# Patient Record
Sex: Female | Born: 1985 | Race: Black or African American | Hispanic: No | Marital: Single | Smoking: Never smoker
Health system: Southern US, Community
[De-identification: ages and names within clinical notes are randomized; demographics above are authoritative.]

## PROBLEM LIST (undated history)

## (undated) DIAGNOSIS — R51 Headache: Secondary | ICD-10-CM

## (undated) HISTORY — PX: NO PAST SURGERIES: SHX2092

---

## 2007-07-27 ENCOUNTER — Emergency Department (HOSPITAL_COMMUNITY): Admission: EM | Admit: 2007-07-27 | Discharge: 2007-07-27 | Payer: Self-pay | Admitting: Emergency Medicine

## 2008-03-12 ENCOUNTER — Inpatient Hospital Stay (HOSPITAL_COMMUNITY): Admission: AD | Admit: 2008-03-12 | Discharge: 2008-03-12 | Payer: Self-pay | Admitting: Obstetrics & Gynecology

## 2008-05-26 ENCOUNTER — Emergency Department (HOSPITAL_COMMUNITY): Admission: EM | Admit: 2008-05-26 | Discharge: 2008-05-26 | Payer: Self-pay | Admitting: Emergency Medicine

## 2008-06-06 ENCOUNTER — Emergency Department (HOSPITAL_COMMUNITY): Admission: EM | Admit: 2008-06-06 | Discharge: 2008-06-06 | Payer: Self-pay | Admitting: Emergency Medicine

## 2008-08-19 ENCOUNTER — Ambulatory Visit (HOSPITAL_COMMUNITY): Admission: RE | Admit: 2008-08-19 | Discharge: 2008-08-19 | Payer: Self-pay | Admitting: Obstetrics & Gynecology

## 2008-10-12 ENCOUNTER — Inpatient Hospital Stay (HOSPITAL_COMMUNITY): Admission: AD | Admit: 2008-10-12 | Discharge: 2008-10-12 | Payer: Self-pay | Admitting: Family Medicine

## 2008-10-12 ENCOUNTER — Ambulatory Visit: Payer: Self-pay | Admitting: Obstetrics & Gynecology

## 2008-10-19 ENCOUNTER — Inpatient Hospital Stay (HOSPITAL_COMMUNITY): Admission: AD | Admit: 2008-10-19 | Discharge: 2008-10-19 | Payer: Self-pay | Admitting: Obstetrics & Gynecology

## 2008-10-19 ENCOUNTER — Ambulatory Visit: Payer: Self-pay | Admitting: Obstetrics and Gynecology

## 2009-01-06 ENCOUNTER — Inpatient Hospital Stay (HOSPITAL_COMMUNITY): Admission: AD | Admit: 2009-01-06 | Discharge: 2009-01-06 | Payer: Self-pay | Admitting: Obstetrics & Gynecology

## 2009-01-06 ENCOUNTER — Inpatient Hospital Stay (HOSPITAL_COMMUNITY): Admission: AD | Admit: 2009-01-06 | Discharge: 2009-01-08 | Payer: Self-pay | Admitting: Family Medicine

## 2009-01-06 ENCOUNTER — Ambulatory Visit: Payer: Self-pay | Admitting: Family Medicine

## 2010-04-07 ENCOUNTER — Encounter: Admission: RE | Admit: 2010-04-07 | Discharge: 2010-04-07 | Payer: Self-pay | Admitting: Internal Medicine

## 2011-03-07 LAB — CBC
HCT: 35.2 % — ABNORMAL LOW (ref 36.0–46.0)
MCV: 90.6 fL (ref 78.0–100.0)
WBC: 6.4 10*3/uL (ref 4.0–10.5)

## 2011-08-15 LAB — CBC
HCT: 33.4 — ABNORMAL LOW
MCHC: 34.1
MCV: 87.8

## 2011-08-15 LAB — HCG, QUANTITATIVE, PREGNANCY: hCG, Beta Chain, Quant, S: <2

## 2011-08-17 LAB — URINALYSIS, ROUTINE W REFLEX MICROSCOPIC
Bilirubin Urine: NEGATIVE
Glucose, UA: NEGATIVE
Ketones, ur: >80 — AB
Nitrite: NEGATIVE
Protein, ur: NEGATIVE
pH: 7

## 2011-08-17 LAB — URINE MICROSCOPIC-ADD ON

## 2011-08-17 LAB — POCT PREGNANCY, URINE: Preg Test, Ur: POSITIVE

## 2011-08-17 LAB — RPR: RPR Ser Ql: NONREACTIVE

## 2011-08-18 LAB — URINALYSIS, ROUTINE W REFLEX MICROSCOPIC: Specific Gravity, Urine: 1.035 — ABNORMAL HIGH

## 2011-08-21 ENCOUNTER — Encounter (HOSPITAL_COMMUNITY): Payer: Self-pay

## 2011-08-21 ENCOUNTER — Inpatient Hospital Stay (HOSPITAL_COMMUNITY): Payer: BC Managed Care – PPO

## 2011-08-21 ENCOUNTER — Inpatient Hospital Stay (HOSPITAL_COMMUNITY)
Admission: AD | Admit: 2011-08-21 | Discharge: 2011-08-21 | Disposition: A | Discharge status: Home / Self Care | Payer: BC Managed Care – PPO | Source: Ambulatory Visit | Attending: Obstetrics and Gynecology | Admitting: Obstetrics and Gynecology

## 2011-08-21 DIAGNOSIS — O34599 Maternal care for other abnormalities of gravid uterus, unspecified trimester: Secondary | ICD-10-CM | POA: Insufficient documentation

## 2011-08-21 DIAGNOSIS — R109 Unspecified abdominal pain: Secondary | ICD-10-CM | POA: Insufficient documentation

## 2011-08-21 DIAGNOSIS — Z3200 Encounter for pregnancy test, result unknown: Secondary | ICD-10-CM

## 2011-08-21 DIAGNOSIS — N83209 Unspecified ovarian cyst, unspecified side: Secondary | ICD-10-CM

## 2011-08-21 DIAGNOSIS — R51 Headache: Secondary | ICD-10-CM | POA: Insufficient documentation

## 2011-08-21 LAB — COMPREHENSIVE METABOLIC PANEL
ALT: 19 U/L (ref 0–35)
AST: 13 U/L (ref 0–37)
Alkaline Phosphatase: 53 U/L (ref 39–117)
GFR calc Af Amer: >90 mL/min (ref >90)
Potassium: 3.5 mEq/L (ref 3.5–5.1)
Sodium: 134 mEq/L — ABNORMAL LOW (ref 135–145)
Total Bilirubin: 0.2 mg/dL — ABNORMAL LOW (ref 0.3–1.2)
Total Protein: 7.4 g/dL (ref 6.0–8.3)

## 2011-08-21 LAB — CBC
Hemoglobin: 11 g/dL — ABNORMAL LOW (ref 12.0–15.0)
MCH: 29.3 pg (ref 26.0–34.0)
MCHC: 33.4 g/dL (ref 30.0–36.0)
Platelets: 327 10*3/uL (ref 150–400)
RBC: 3.76 MIL/uL — ABNORMAL LOW (ref 3.87–5.11)
WBC: 6.1 10*3/uL (ref 4.0–10.5)

## 2011-08-21 MED ORDER — BUTALBITAL-APAP-CAFFEINE 50-325-40 MG PO TABS
2.0000 | ORAL_TABLET | Freq: Once | ORAL | Status: AC
  Administered 2011-08-21: 2 via ORAL
  Filled 2011-08-21: qty 2

## 2011-08-21 NOTE — ED Provider Notes (Signed)
History   Pt presents today with multiple complaints. She is an extremely poor historian. She was initially seen today by urgent care. Pt c/o weakness, joint pain, HA, and abd pain. She denies vag dc, bleeding, or fever. Pt was sent to the MAU because she also had a positive preg test.  Chief Complaint  Patient presents with  . Dizziness   HPI  OB History    Grav Para Term Preterm Abortions TAB SAB Ect Mult Living   4 3 3  0  0 0 0 0 3      Past Medical History  Diagnosis Date  . No pertinent past medical history     Past Surgical History  Procedure Date  . No past surgeries     No family history on file.  History  Substance Use Topics  . Smoking status: Never Smoker   . Smokeless tobacco: Not on file  . Alcohol Use: No    Allergies: No Known Allergies  No prescriptions prior to admission    Review of Systems  Constitutional: Negative for fever.  Eyes: Negative for blurred vision.  Cardiovascular: Negative for chest pain and palpitations.  Gastrointestinal: Positive for abdominal pain. Negative for nausea, vomiting, diarrhea and constipation.  Genitourinary: Negative for dysuria, urgency, frequency and hematuria.  Musculoskeletal: Positive for joint pain.  Neurological: Positive for dizziness, weakness and headaches.  Psychiatric/Behavioral: Negative for depression and suicidal ideas.   Physical Exam   Blood pressure 120/55, pulse 74, temperature 98 F (36.7 C), temperature source Oral, resp. rate 20, height 5\' 2"  (1.575 m), weight 201 lb (91.173 kg), last menstrual period 06/14/2011, SpO2 97.00%.  Physical Exam  Constitutional: She is oriented to person, place, and time. She appears well-developed and well-nourished. No distress.  HENT:  Head: Normocephalic and atraumatic.  Eyes: EOM are normal. Pupils are equal, round, and reactive to light.  GI: Soft. She exhibits no distension. There is no tenderness. There is no rebound and no guarding.  Genitourinary:  No bleeding around the vagina. No vaginal discharge found.       Cervix Lg/closed. Bimanual exam difficult secondary to increased body habitus.  Neurological: She is alert and oriented to person, place, and time.  Skin: Skin is warm and dry. She is not diaphoretic.  Psychiatric: Her speech is normal and behavior is normal. Thought content normal. Her affect is blunt.    MAU Course  Procedures    Assessment and Plan  Care of pt turned over to [facility], FNP at 8pm.  Clinton Gallant. Rice III, DrHSc, MPAS, PA-C  08/21/2011, 7:32 PM   Assessment: IUP at 9.[redacted] weeks gestation   Ovarian cyst left   Headache - resolved with fiorset      Plan:  Start [facility]   Start PNV   Work note   Return as needed.  Crystal Figueroa, Crystal Figueroa 08/21/11 1957  US Ob Comp Less 14 Wks  08/21/2011  *RADIOLOGY REPORT*  Clinical Data: Pain  OBSTETRIC <14 WK [place] AND TRANSVAGINAL OB US  Technique:  Both transabdominal and transvaginal ultrasound examinations were performed for complete evaluation of the gestation as well as the maternal uterus, adnexal regions, and pelvic cul-de-sac.  Transvaginal technique was performed to assess early pregnancy.  Comparison:  None.  Intrauterine gestational sac:  Visualized/normal in shape. Yolk sac: Visualized Embryo: Visualized Cardiac Activity: Visualized Heart Rate: 179 bpm  CRL: 24   mm  9   w  1   d  [place] EDC: 03/24/2012  Maternal uterus/adnexae: The uterus is otherwise normal. No evidence of subchorionic hemorrhage/hematoma.  The right ovary is normal in size, measuring 2.0 x 1.6 x 1.7 cm.  No discrete right-sided adnexal lesions.  The left ovary is normal in size, measuring 3.9 x 2.5 x 3.0 cm.  There is an approximately 2.8 cm cyst within the left ovary.  No free fluid within the pelvis.  IMPRESSION: 1.  Single intrauterine pregnancy with crown-rump length measuring 24 mm compatible with 9 weeks, 1 days of gestation and estimated delivery date of 03/24/2012.  Full anatomy scan at 18-21 weeks  is recommended.  2.  Likely 2.8 cm left sided corpus luteal cyst.  Original Report Authenticated By: Waynard Reeds, M.D.    Results for orders placed during the hospital encounter of 08/21/11 (from the past 24 hour(s))  GLUCOSE, CAPILLARY     Status: Normal   Collection Time   08/21/11  7:08 PM      Component Value Range   Glucose-Capillary 93  70 - 99 (mg/dL)  GLUCOSE, CAPILLARY     Status: Normal   Collection Time   08/21/11  7:08 PM      Component Value Range   Glucose-Capillary 93  70 - 99 (mg/dL)  CBC     Status: Abnormal   Collection Time   08/21/11  7:31 PM      Component Value Range   WBC 6.1  4.0 - 10.5 (K/uL)   RBC 3.76 (*) 3.87 - 5.11 (MIL/uL)   Hemoglobin 11.0 (*) 12.0 - 15.0 (g/dL)   HCT 16.1 (*) 09.6 - 46.0 (%)   MCV 87.5  78.0 - 100.0 (fL)   MCH 29.3  26.0 - 34.0 (pg)   MCHC 33.4  30.0 - 36.0 (g/dL)   RDW 04.5  40.9 - 81.1 (%)   Platelets 327  150 - 400 (K/uL)  COMPREHENSIVE METABOLIC PANEL     Status: Abnormal   Collection Time   08/21/11  7:31 PM      Component Value Range   Sodium 134 (*) 135 - 145 (mEq/L)   Potassium 3.5  3.5 - 5.1 (mEq/L)   Chloride 100  96 - 112 (mEq/L)   CO2 27  19 - 32 (mEq/L)   Glucose, Bld 83  70 - 99 (mg/dL)   BUN 11  6 - 23 (mg/dL)   Creatinine, Ser 9.14  0.50 - 1.10 (mg/dL)   Calcium 9.7  8.4 - 78.2 (mg/dL)   Total Protein 7.4  6.0 - 8.3 (g/dL)   Albumin 3.3 (*) 3.5 - 5.2 (g/dL)   AST 13  0 - 37 (U/L)   ALT 19  0 - 35 (U/L)   Alkaline Phosphatase 53  39 - 117 (U/L)   Total Bilirubin 0.2 (*) 0.3 - 1.2 (mg/dL)   GFR calc non Af Amer >90  >90 (mL/min)   GFR calc Af Amer >90  >90 (mL/min)  HCG, QUANTITATIVE, PREGNANCY     Status: Abnormal   Collection Time   08/21/11  7:34 PM      Component Value Range   hCG, Beta Nyra Jabs, S 95621 (*) <5 (mIU/mL)    Kerrie Buffalo, NP 08/21/11 2134

## 2011-08-21 NOTE — Progress Notes (Signed)
Pt states has had a headache & dizziness x1 week. Also complains of abdominal cramping & pain "everywhere" that comes & goes. Denies vaginal bleeding or discharge. States hasn't had a period since July & had a positive UPT at urgent care today.

## 2011-08-22 LAB — URINALYSIS, ROUTINE W REFLEX MICROSCOPIC
Glucose, UA: NEGATIVE
Ketones, ur: NEGATIVE
Nitrite: NEGATIVE
Protein, ur: NEGATIVE
Protein, ur: NEGATIVE
Specific Gravity, Urine: <1.005 — ABNORMAL LOW
Urobilinogen, UA: 0.2

## 2011-09-02 ENCOUNTER — Emergency Department (HOSPITAL_COMMUNITY)
Admission: EM | Admit: 2011-09-02 | Discharge: 2011-09-02 | Disposition: A | Discharge status: Home / Self Care | Payer: BC Managed Care – PPO | Attending: Emergency Medicine | Admitting: Emergency Medicine

## 2011-09-02 DIAGNOSIS — O99891 Other specified diseases and conditions complicating pregnancy: Secondary | ICD-10-CM | POA: Insufficient documentation

## 2011-09-02 DIAGNOSIS — R51 Headache: Secondary | ICD-10-CM | POA: Insufficient documentation

## 2011-09-02 DIAGNOSIS — R5381 Other malaise: Secondary | ICD-10-CM | POA: Insufficient documentation

## 2011-09-02 DIAGNOSIS — IMO0001 Reserved for inherently not codable concepts without codable children: Secondary | ICD-10-CM | POA: Insufficient documentation

## 2011-09-02 DIAGNOSIS — R112 Nausea with vomiting, unspecified: Secondary | ICD-10-CM | POA: Insufficient documentation

## 2011-09-02 DIAGNOSIS — F329 Major depressive disorder, single episode, unspecified: Secondary | ICD-10-CM | POA: Insufficient documentation

## 2011-09-02 DIAGNOSIS — F3289 Other specified depressive episodes: Secondary | ICD-10-CM | POA: Insufficient documentation

## 2011-09-02 DIAGNOSIS — R630 Anorexia: Secondary | ICD-10-CM | POA: Insufficient documentation

## 2011-09-02 LAB — URINE MICROSCOPIC-ADD ON

## 2011-09-02 LAB — COMPREHENSIVE METABOLIC PANEL
ALT: 38 U/L — ABNORMAL HIGH (ref 0–35)
AST: 15 U/L (ref 0–37)
Albumin: 3 g/dL — ABNORMAL LOW (ref 3.5–5.2)
Alkaline Phosphatase: 47 U/L (ref 39–117)
BUN: 5 mg/dL — ABNORMAL LOW (ref 6–23)
Chloride: 103 mEq/L (ref 96–112)
Potassium: 3.2 mEq/L — ABNORMAL LOW (ref 3.5–5.1)
Total Bilirubin: 0.3 mg/dL (ref 0.3–1.2)

## 2011-09-02 LAB — URINALYSIS, ROUTINE W REFLEX MICROSCOPIC
Bilirubin Urine: NEGATIVE
Glucose, UA: NEGATIVE mg/dL
Ketones, ur: NEGATIVE mg/dL
Protein, ur: NEGATIVE mg/dL

## 2011-09-02 LAB — DIFFERENTIAL
Basophils Absolute: 0 10*3/uL (ref 0.0–0.1)
Eosinophils Relative: 2 % (ref 0–5)
Lymphocytes Relative: 38 % (ref 12–46)
Neutro Abs: 2.5 10*3/uL (ref 1.7–7.7)

## 2011-09-02 LAB — CBC
HCT: 28.6 % — ABNORMAL LOW (ref 36.0–46.0)
RDW: 12.5 % (ref 11.5–15.5)
WBC: 4.8 10*3/uL (ref 4.0–10.5)

## 2011-09-02 LAB — LIPASE, BLOOD: Lipase: 14 U/L (ref 11–59)

## 2011-09-13 ENCOUNTER — Other Ambulatory Visit: Payer: Self-pay | Admitting: Family Medicine

## 2011-09-13 DIAGNOSIS — Z3689 Encounter for other specified antenatal screening: Secondary | ICD-10-CM

## 2011-09-13 LAB — HIV ANTIBODY (ROUTINE TESTING W REFLEX): HIV: NONREACTIVE

## 2011-09-13 LAB — ANTIBODY SCREEN: Antibody Screen: NEGATIVE

## 2011-09-13 LAB — RPR: RPR: NONREACTIVE

## 2011-09-13 LAB — HEPATITIS B SURFACE ANTIGEN: Hepatitis B Surface Ag: NEGATIVE

## 2011-10-18 ENCOUNTER — Ambulatory Visit (HOSPITAL_COMMUNITY)
Admission: RE | Admit: 2011-10-18 | Discharge: 2011-10-18 | Disposition: A | Discharge status: Home / Self Care | Payer: Medicaid Other | Source: Ambulatory Visit | Attending: Family Medicine | Admitting: Family Medicine

## 2011-10-18 DIAGNOSIS — Z1389 Encounter for screening for other disorder: Secondary | ICD-10-CM | POA: Insufficient documentation

## 2011-10-18 DIAGNOSIS — O358XX0 Maternal care for other (suspected) fetal abnormality and damage, not applicable or unspecified: Secondary | ICD-10-CM | POA: Insufficient documentation

## 2011-10-18 DIAGNOSIS — Z363 Encounter for antenatal screening for malformations: Secondary | ICD-10-CM | POA: Insufficient documentation

## 2011-10-18 DIAGNOSIS — Z3689 Encounter for other specified antenatal screening: Secondary | ICD-10-CM

## 2011-11-08 ENCOUNTER — Other Ambulatory Visit (HOSPITAL_COMMUNITY): Payer: Self-pay | Admitting: Physician Assistant

## 2011-11-08 DIAGNOSIS — R319 Hematuria, unspecified: Secondary | ICD-10-CM

## 2011-11-15 ENCOUNTER — Ambulatory Visit (HOSPITAL_COMMUNITY)
Admission: RE | Admit: 2011-11-15 | Discharge: 2011-11-15 | Disposition: A | Discharge status: Home / Self Care | Payer: BC Managed Care – PPO | Source: Ambulatory Visit | Attending: Physician Assistant | Admitting: Physician Assistant

## 2011-11-15 DIAGNOSIS — O99891 Other specified diseases and conditions complicating pregnancy: Secondary | ICD-10-CM | POA: Insufficient documentation

## 2011-11-15 DIAGNOSIS — R319 Hematuria, unspecified: Secondary | ICD-10-CM | POA: Insufficient documentation

## 2012-02-21 ENCOUNTER — Observation Stay (HOSPITAL_COMMUNITY)
Admission: AD | Admit: 2012-02-21 | Discharge: 2012-02-23 | Disposition: A | Discharge status: Home / Self Care | Payer: BC Managed Care – PPO | Source: Ambulatory Visit | Attending: Obstetrics & Gynecology | Admitting: Obstetrics & Gynecology

## 2012-02-21 ENCOUNTER — Encounter (HOSPITAL_COMMUNITY): Payer: Self-pay | Admitting: *Deleted

## 2012-02-21 ENCOUNTER — Inpatient Hospital Stay (HOSPITAL_COMMUNITY): Payer: BC Managed Care – PPO

## 2012-02-21 DIAGNOSIS — O4690 Antepartum hemorrhage, unspecified, unspecified trimester: Principal | ICD-10-CM | POA: Insufficient documentation

## 2012-02-21 DIAGNOSIS — O4693 Antepartum hemorrhage, unspecified, third trimester: Secondary | ICD-10-CM | POA: Diagnosis present

## 2012-02-21 LAB — URINALYSIS, ROUTINE W REFLEX MICROSCOPIC
Bilirubin Urine: NEGATIVE
Glucose, UA: NEGATIVE mg/dL
Ketones, ur: NEGATIVE mg/dL
pH: 6.5 (ref 5.0–8.0)

## 2012-02-21 LAB — URINE MICROSCOPIC-ADD ON

## 2012-02-21 MED ORDER — ACETAMINOPHEN 325 MG PO TABS: 650.0000 mg | ORAL_TABLET | ORAL | Status: DC | PRN

## 2012-02-21 MED ORDER — CALCIUM CARBONATE ANTACID 500 MG PO CHEW: 2.0000 | CHEWABLE_TABLET | ORAL | Status: DC | PRN

## 2012-02-21 MED ORDER — DOCUSATE SODIUM 100 MG PO CAPS
100.0000 mg | ORAL_CAPSULE | Freq: Every day | ORAL | Status: DC
  Administered 2012-02-22: 100 mg via ORAL
  Filled 2012-02-21: qty 1

## 2012-02-21 MED ORDER — PRENATAL MULTIVITAMIN CH
1.0000 | ORAL_TABLET | Freq: Every day | ORAL | Status: DC
  Administered 2012-02-22: 1 via ORAL
  Filled 2012-02-21: qty 1

## 2012-02-21 MED ORDER — ZOLPIDEM TARTRATE 10 MG PO TABS: 10.0000 mg | ORAL_TABLET | Freq: Every evening | ORAL | Status: DC | PRN

## 2012-02-21 NOTE — H&P (Signed)
Crystal Figueroa is a 26 y.o. female presenting at 36.0wks for acute onset of vaginal bleeding x1. Pt awoke this morning and used the bathroom. As she walked back to her bed she felt a warm sensation running down her leg and noticed she was bleeding from her vagina. Low volume discharge per pt. Pt became worried and came to MAU. Denies any complications w/ previous pregnancies. All were SVD. PNC started at [redacted]wks gestation. Pt feels baby moving frequently but denies painful contractions. Endorses low back pain and feelings of "pressure."    Maternal Medical History:  Reason for admission: Reason for admission: vaginal bleeding.  Reason for Admission:   nauseaContractions: Frequency: irregular.   Perceived severity is mild.    Fetal activity: Perceived fetal activity is normal.   Last perceived fetal movement was within the past hour.     [redacted]w[redacted]d  OB History    Grav Para Term Preterm Abortions TAB SAB Ect Mult Living   4 3 3  0  0 0 0 0 3     Past Medical History  Diagnosis Date  . No pertinent past medical history    Past Surgical History  Procedure Date  . No past surgeries    Family History: family history is negative for Anesthesia problems. Social History:  reports that she has never smoked. She does not have any smokeless tobacco history on file. She reports that she does not drink alcohol or use illicit drugs.  Review of Systems  Constitutional: Negative for fever and chills.  Eyes: Negative for blurred vision and double vision.  Respiratory: Negative for cough and hemoptysis.   Cardiovascular: Negative for chest pain, palpitations and claudication.  Gastrointestinal: Negative for nausea, vomiting, abdominal pain, diarrhea, constipation and blood in stool.  Genitourinary: Negative for dysuria, urgency, frequency and hematuria.  Skin: Negative for itching and rash.  Neurological: Positive for headaches. Negative for dizziness and tingling.    Dilation: 1 Effacement (%):  Thick Exam by:: Dr. Konrad Dolores Blood pressure 105/56, pulse 90, temperature 98 F (36.7 C), temperature source Oral, resp. rate 18, height 5\' 2"  (1.575 m), weight 91.853 kg (202 lb 8 oz), last menstrual period 06/14/2011. Maternal Exam:  Uterine Assessment: Contraction strength is mild.  Contraction frequency is irregular.  Occuring irregularly every 6-16 min.  Abdomen: Fundal height is appropriate for GA.   Fetal presentation: vertex  Introitus: Normal vulva. Normal vagina.  Vagina is negative for discharge.  Ferning test: not done.  Nitrazine test: not done. Amniotic fluid character: not assessed.  Cervix: Cervix evaluated by sterile speculum exam and digital exam.     Physical Exam  Constitutional: She appears well-developed and well-nourished. No distress.  HENT:  Head: Normocephalic.  Eyes: EOM are normal.  Neck: Normal range of motion. Neck supple.  Cardiovascular: Normal rate, regular rhythm, normal heart sounds and intact distal pulses.  Exam reveals no gallop and no friction rub.   No murmur heard. Respiratory: Effort normal. No respiratory distress.  GI: Soft. Bowel sounds are normal. She exhibits no distension and no mass. There is no tenderness. There is no rebound and no guarding.  Genitourinary: Vagina normal and uterus normal. No vaginal discharge found.       Small amount of pooled around the cervix w/ some small clots present. Pt cervix 1-1.5 and long  Musculoskeletal: Normal range of motion.  Skin: Skin is warm and dry. She is not diaphoretic.  Psychiatric: She has a normal mood and affect. Her behavior is normal.  Prenatal labs: ABO, Rh:   Antibody:   Rubella:   RPR:    HBsAg:    HIV:    GBS:     Assessment/Plan: 26yo G4P3003 at 36 wks w/ concern for vaginal bleeding. Likely marginal separation. BPP reasuring at 8/8. US showing placenta @ "Rt lateral, above cervical os". Low concern for abruption as non-painful and reassurring fetal heart tones on  monitor.  - Admit for Obs - Monitor for DC - Place on fetal monitoring - Likely DC after 24 hrs.   MERRELL, DAVID 02/21/2012, 12:44 PM    Reviewed results and documentation of Resident and discussed with Dr. Debroah Loop: place in Antenatal  For 23 hr obs.

## 2012-02-21 NOTE — MAU Note (Signed)
Pt states she is having thick bright red vaginal bleeding since 0600 this morning.  Denies any ROM.

## 2012-02-22 NOTE — Progress Notes (Signed)
Patient ID: Crystal Figueroa, female   DOB: Apr 01, 1986, 26 y.o.   MRN: [id number] FACULTY PRACTICE ANTEPARTUM(COMPREHENSIVE) NOTE  Crystal Figueroa is a 26 y.o. G4P3003 at [redacted]w[redacted]d  who is admitted for bleeding.   Fetal presentation is cephalic. Length of Stay:  2  Days  Subjective: No complaints, no current bleeding Patient reports the fetal movement as active. Patient reports uterine contraction  activity as none. Patient reports  vaginal bleeding as spotting. None since last night Patient describes fluid per vagina as None.  Vitals:  Blood pressure 107/54, pulse 88, temperature 97.9 F (36.6 C), temperature source Oral, resp. rate 20, height 5\' 2"  (1.575 m), weight 202 lb 8 oz (91.853 kg), last menstrual period 06/14/2011. Physical Examination:  General appearance - alert, well appearing, and in no distress Abdomen - nontender soft Neurological - alert, oriented, normal speech, no focal findings or movement disorder noted, DTR's normal and symmetric Extremities , no pedal edema noted Fundal Height:  size equals dates Pelvic Exam:   Cervical Exam: Not evaluated. and found to be not evaluated/ na/na and fetal presentation is cephalic. Extremities: Homans sign is negative, no sign of DVT with DTRs 2+ bilaterally Membranes:intact  Fetal Monitoring:  Baseline: 140s bpm, Variability: Good {> 6 bpm) and Accelerations: Reactive  Labs:  Recent Results (from the past 24 hour(s))  URINALYSIS, ROUTINE W REFLEX MICROSCOPIC   Collection Time   02/21/12  8:55 AM      Component Value Range   Color, Urine YELLOW  YELLOW    APPearance CLEAR  CLEAR    Specific Gravity, Urine <1.005 (*) 1.005 - 1.030    pH 6.5  5.0 - 8.0    Glucose, UA NEGATIVE  NEGATIVE (mg/dL)   Hgb urine dipstick LARGE (*) NEGATIVE    Bilirubin Urine NEGATIVE  NEGATIVE    Ketones, ur NEGATIVE  NEGATIVE (mg/dL)   Protein, ur NEGATIVE  NEGATIVE (mg/dL)   Urobilinogen, UA 0.2  0.0 - 1.0 (mg/dL)   Nitrite NEGATIVE  NEGATIVE    Leukocytes, UA NEGATIVE  NEGATIVE   URINE MICROSCOPIC-ADD ON   Collection Time   02/21/12  8:55 AM      Component Value Range   Squamous Epithelial / LPF RARE  RARE    WBC, UA 0-2  <3 (WBC/hpf)   RBC / HPF 0-2  <3 (RBC/hpf)    Imaging Studies:      Medications:  Scheduled    . docusate sodium  100 mg Oral Daily  . prenatal multivitamin  1 tablet Oral Daily   I have reviewed the patient's current medications.  ASSESSMENT: [redacted] weeks gestation with 3rd trimester bleeding, probably marginal sinus Bleeding has stopped about 12 hours, no labor  PLAN: Continue in house observation for now with bleeding having stopped just 12 hours previously, no signs of labor  Leonila Speranza H 02/22/2012,7:52 AM

## 2012-02-22 NOTE — Progress Notes (Signed)
UR Chart review completed.  

## 2012-02-23 DIAGNOSIS — O479 False labor, unspecified: Secondary | ICD-10-CM

## 2012-02-23 NOTE — Discharge Summary (Addendum)
Obstetric Discharge Summary Reason for Admission: Vaginal bleeding Prenatal Procedures: ultrasound showing placenta at Rt lateral position above the cervical os w/ BPP of 8/8  Hemoglobin  Date Value Range Status  09/02/2011 9.8* 12.0-15.0 (g/dL) Final     HCT  Date Value Range Status  09/02/2011 28.6* 36.0-46.0 (%) Final    Physical Exam:  General: alert, cooperative, appears stated age and no distress CV: rrr Resp: normal effort Ext: 2+ distal pulses, trace LE edema, no calf tenderness  Discharge Diagnoses: Bloody discharge due to cervical changes.   Discharge Information: 26 yo [redacted]w[redacted]d f w/ minimal bloody discharge x1 w/o discharge since admission. Reasons for return discussed w/ pt. Pt to resume normal PNC.  Date: 02/23/2012 Activity: unrestricted Diet: routine Medications: PNV Condition: stable Discharge to: home   Byrant Valent 02/23/2012, 10:06 AM

## 2012-02-23 NOTE — Discharge Summary (Signed)
I was present for the exam and agree with above.  Dorathy Kinsman 02/23/2012 5:26 PM

## 2012-02-23 NOTE — Discharge Instructions (Signed)
Everything appears to be going well with your pregnancy. Please continue to see your OB for normal prenatal care. Please return to the hospital if you experience severe abdominal pain, feel light headed or pass out, if you develop a fever, or feel a gush of fluid. Have a great weekend.

## 2012-02-26 LAB — STREP B DNA PROBE: GBS: NEGATIVE

## 2012-03-04 ENCOUNTER — Inpatient Hospital Stay (HOSPITAL_COMMUNITY)
Admission: AD | Admit: 2012-03-04 | Discharge: 2012-03-04 | Disposition: A | Discharge status: Home / Self Care | Payer: BC Managed Care – PPO | Source: Ambulatory Visit | Attending: Obstetrics and Gynecology | Admitting: Obstetrics and Gynecology

## 2012-03-04 ENCOUNTER — Inpatient Hospital Stay (HOSPITAL_COMMUNITY): Payer: BC Managed Care – PPO

## 2012-03-04 ENCOUNTER — Encounter (HOSPITAL_COMMUNITY): Payer: Self-pay | Admitting: *Deleted

## 2012-03-04 DIAGNOSIS — O4690 Antepartum hemorrhage, unspecified, unspecified trimester: Secondary | ICD-10-CM | POA: Insufficient documentation

## 2012-03-04 DIAGNOSIS — O479 False labor, unspecified: Secondary | ICD-10-CM

## 2012-03-04 HISTORY — DX: Headache: R51

## 2012-03-04 NOTE — MAU Note (Signed)
Patient states she has a little bloody show and some contractions " that are not that bad". Reports good fetal movement.

## 2012-03-04 NOTE — MAU Note (Signed)
Has been having contractions every day since last week, getting closer and stronger.  Bleeding noted this morning at 0615.  Denies rom.

## 2012-03-04 NOTE — MAU Provider Note (Signed)
History     CSN: [id number]  Arrival date and time: 03/04/12 0806   None     Chief Complaint  Patient presents with  . Labor Eval   HPI  Ms. Crystal Figueroa is a [phone] [redacted]w[redacted]d female here for vaginal bleeding, lumbar pain, upper abdominal pain and suprapubic pain. She was admitted to the antenatal unit on 02/21/12 for vaginal bleeding and was discharged on the same day following ultrasound showing placenta at Rt lateral position above the cervical os w/ BPP of 8/8 and decreased in vaginal bleeding. Reports no SROM.   She reports bloody vaginal discharge starting this morning from 6:15 am. She describes the amount as "a lot" and reports that a handful sized clot was seen in the toilet.    Her pain started last night and is consistent with the onset of contractions. Her contractions are 7-10 minutes apart. Her pain is described as pressure and is a 6/10.     OB History    Grav Para Term Preterm Abortions TAB SAB Ect Mult Living   4 3 3  0  0 0 0 0 3      Past Medical History  Diagnosis Date  . Headache     Past Surgical History  Procedure Date  . No past surgeries     Family History  Problem Relation Age of Onset  . Anesthesia problems Neg Hx     History  Substance Use Topics  . Smoking status: Never Smoker   . Smokeless tobacco: Never Used  . Alcohol Use: No    Allergies: No Known Allergies  Prescriptions prior to admission  Medication Sig Dispense Refill  . Prenatal Vit-Fe Fumarate-FA (PRENATAL MULTIVITAMIN) TABS Take 1 tablet by mouth at bedtime.         Review of Systems  Constitutional: Negative for fever and chills.  Eyes: Negative for blurred vision.  Respiratory: Negative for shortness of breath.   Cardiovascular: Negative for claudication and leg swelling.  Gastrointestinal: Positive for abdominal pain. Negative for nausea and vomiting.  Neurological: Positive for headaches. Negative for dizziness.   Physical Exam   Blood pressure 98/52, pulse 96,  temperature 98.8 F (37.1 C), temperature source Oral, resp. rate 16, height 5\' 1"  (1.549 m), weight 91.173 kg (201 lb), last menstrual period 06/14/2011, SpO2 98.00%.  Physical Exam  Constitutional: She appears well-developed and well-nourished.  HENT:  Head: Normocephalic.  Cardiovascular: Normal rate, regular rhythm, normal heart sounds and intact distal pulses.  Exam reveals no gallop and no friction rub.   No murmur heard. Respiratory: Effort normal and breath sounds normal. No respiratory distress. She has no wheezes. She has no rales.  Homans sign is negative, no sign of DVT with DTRs 2+ bilaterally   MAU Course  Procedures  MDM  Assessment and Plan  1.) Vaginal Bleeding  US Fetal BPP w/o non stress ordered  Monitor bleeding Monitor NST  Consider Admitting    Crystal Figueroa 03/04/2012, 9:31 AM    RESIDENT ADDENDUM   Pt seen and examined. Chart Reviewed  [place] as below: Placenta Posterior and above the cervical os. AFI 13.34. BPP 8/8. Cervix long/closed. No acute abnormality. Vertex.  PE: Gen: no distress CV: RRR.  Resp: Normal effort Extremities: trace edema, 2+ pulses Abd: Non-painful to palpation. Gravid Vaginal: mucusy mildly bloody discharge.   Dilation: 1 Effacement (%): 30 Cervical Position: Middle Station: -2 Presentation: Undeterminable Exam by:: Dr. Suzzanne Figueroa mucusy trace bloody discharge from cervical opening.   A/P  26yo [redacted]w[redacted]d [id number], w/ vaginal bleeding. Pt recently admitted and observed for >24 hrs w/ similar complaints. Vaginal/speculum exam reasurring. Likely due to cervical change. Low concern for marginal separation or abruption. Reasons for return discussed w/ pt.    Patient seen and examined.  Agree with above note.  Crystal Figueroa 03/04/2012 12:54 PM

## 2012-03-05 ENCOUNTER — Encounter (HOSPITAL_COMMUNITY): Payer: Self-pay | Admitting: *Deleted

## 2012-03-05 ENCOUNTER — Inpatient Hospital Stay (HOSPITAL_COMMUNITY)
Admission: AD | Admit: 2012-03-05 | Discharge: 2012-03-09 | DRG: 651 | Disposition: A | Discharge status: Home / Self Care | Payer: BC Managed Care – PPO | Source: Ambulatory Visit | Attending: Obstetrics and Gynecology | Admitting: Obstetrics and Gynecology

## 2012-03-05 DIAGNOSIS — Z283 Underimmunization status: Secondary | ICD-10-CM

## 2012-03-05 DIAGNOSIS — O4693 Antepartum hemorrhage, unspecified, third trimester: Secondary | ICD-10-CM | POA: Diagnosis present

## 2012-03-05 DIAGNOSIS — O09899 Supervision of other high risk pregnancies, unspecified trimester: Secondary | ICD-10-CM

## 2012-03-05 DIAGNOSIS — IMO0002 Reserved for concepts with insufficient information to code with codable children: Secondary | ICD-10-CM | POA: Insufficient documentation

## 2012-03-05 DIAGNOSIS — O4590 Premature separation of placenta, unspecified, unspecified trimester: Principal | ICD-10-CM | POA: Diagnosis present

## 2012-03-05 DIAGNOSIS — Z2839 Other underimmunization status: Secondary | ICD-10-CM

## 2012-03-05 DIAGNOSIS — Z8659 Personal history of other mental and behavioral disorders: Secondary | ICD-10-CM | POA: Insufficient documentation

## 2012-03-05 DIAGNOSIS — D649 Anemia, unspecified: Secondary | ICD-10-CM | POA: Diagnosis present

## 2012-03-05 LAB — CBC
HCT: 30.2 % — ABNORMAL LOW (ref 36.0–46.0)
Hemoglobin: 10 g/dL — ABNORMAL LOW (ref 12.0–15.0)
MCH: 29.9 pg (ref 26.0–34.0)
MCHC: 33.1 g/dL (ref 30.0–36.0)
MCV: 90.1 fL (ref 78.0–100.0)
RBC: 3.35 MIL/uL — ABNORMAL LOW (ref 3.87–5.11)

## 2012-03-05 MED ORDER — DOCUSATE SODIUM 100 MG PO CAPS
100.0000 mg | ORAL_CAPSULE | Freq: Every day | ORAL | Status: DC
  Administered 2012-03-05: 100 mg via ORAL
  Filled 2012-03-05: qty 1

## 2012-03-05 MED ORDER — CALCIUM CARBONATE ANTACID 500 MG PO CHEW: 2.0000 | CHEWABLE_TABLET | ORAL | Status: DC | PRN

## 2012-03-05 MED ORDER — LACTATED RINGERS IV SOLN
INTRAVENOUS | Status: DC
  Administered 2012-03-05 – 2012-03-06 (×2): via INTRAVENOUS

## 2012-03-05 MED ORDER — ACETAMINOPHEN 325 MG PO TABS: 650.0000 mg | ORAL_TABLET | ORAL | Status: DC | PRN

## 2012-03-05 MED ORDER — ZOLPIDEM TARTRATE 10 MG PO TABS: 10.0000 mg | ORAL_TABLET | Freq: Every evening | ORAL | Status: DC | PRN

## 2012-03-05 MED ORDER — PRENATAL MULTIVITAMIN CH: 1.0000 | ORAL_TABLET | Freq: Every day | ORAL | Status: DC

## 2012-03-05 NOTE — H&P (Signed)
HPI: Crystal Figueroa is a 26 y.o. year old G59P3003 female at [redacted]w[redacted]d weeks gestation by LMP, verified by first trimester [place] who presents to MAU reporting vaginal bleeding. This is her third visit in the past two weeks for the same complaint. She was admitted 4/3-4/5 w/ no further bleeding, seen in MAU yesterday w/ normal [place], small amount bleeding on exam (moderate w/ clots at home) and reports bleeding a large amount at home this evening prior to arrival. The pad she was wearing en route was 80% saturated and the pad she wore for 15 minutes in triage was 30% saturated. She received prenatal care at [facility].  Maternal Medical History:  Reason for admission: Reason for admission: vaginal bleeding.  Reason for Admission:   nausea  OB History    Grav Para Term Preterm Abortions TAB SAB Ect Mult Living   4 3 3  0  0 0 0 0 3     Past Medical History  Diagnosis Date  . Headache    Past Surgical History  Procedure Date  . No past surgeries    Family History: family history is negative for Anesthesia problems. Social History:  reports that she has never smoked. She has never used smokeless tobacco. She reports that she does not drink alcohol or use illicit drugs.  Review of Systems  Constitutional: Negative for fever and chills.  Eyes: Negative for blurred vision.  Gastrointestinal: Positive for abdominal pain. Negative for nausea and vomiting. Blood in stool: mild, intermittent.  Neurological: Negative for dizziness and headaches.  Endo/Heme/Allergies: Does not bruise/bleed easily.    Dilation: 1 Effacement (%): Thick Blood pressure 103/69, pulse 98, temperature 98.2 F (36.8 C), temperature source Oral, resp. rate 18, last menstrual period 06/14/2011. Maternal Exam:  Uterine Assessment: Contraction strength is mild.  Contraction frequency is irregular.   Abdomen: Fundal height is S=D.    Introitus: Vagina is negative for discharge.  Moderate amount of BRB in vault. Small amount oozing from  os.  Pelvis: adequate for delivery.   Cervix: Cervix evaluated by sterile speculum exam and digital exam.     Fetal Exam Fetal Monitor Review: Mode: ultrasound.   Baseline rate: 140.  Variability: moderate (6-25 bpm).   Pattern: accelerations present and no decelerations.    Fetal State Assessment: Category I - tracings are normal.     Physical Exam  Nursing note and vitals reviewed. Constitutional: She is oriented to person, place, and time. She appears well-developed and well-nourished. No distress.  HENT:  Head: Normocephalic.  Eyes: Pupils are equal, round, and reactive to light.  Cardiovascular: Normal rate and regular rhythm.   Respiratory: Effort normal and breath sounds normal.  GI: Soft. There is no tenderness.  Genitourinary: There is no lesion or injury on the right labia. There is no lesion or injury on the left labia. Uterus is not tender. Bleeding: moderate BRB. No vaginal discharge found.  Musculoskeletal: Normal range of motion. She exhibits no edema.  Neurological: She is alert and oriented to person, place, and time. She has normal reflexes.  Skin: Skin is warm and dry. No pallor.  Psychiatric: She has a normal mood and affect.   Dilation: 1 Effacement (%): Thick Cervical Position: Anterior Presenting Part: indeterminate (Vtx per [place] yesterday. Will check w/ BS [place])   Prenatal labs: ABO, Rh: --/--/A POS (04/16 1935) Antibody: NEG (04/16 1935) Rubella: Nonimmune (10/24 0000) RPR: Nonreactive (10/24 0000)  HBsAg: Negative (10/24 0000)  HIV: Non-reactive (10/24 0000)  GBS:  Neg 1 hour GTT 68 Genetic screening declined  Assessment: 1. Labor: none 2. Fetal Wellbeing: Category I  3. Pain Control: None 4. GBS: neg 5. 37.6 week IUP 6. Suspect marginal abruption  Plan:  1. Admit to Antenatal per consult with Dr. Jolayne Panther 2. Routine Antenatal orders 3. IV, T&S, CBC, UDS, RPR 4. Will monitor bleeding over night and determine if pt needs IOL. Discussed  w/ pt the possibility of emergency C/S if bleeding became very heavy and/or for NRFHTs.  Dorathy Kinsman 03/05/2012, 8:36 PM

## 2012-03-05 NOTE — MAU Note (Signed)
Pt states she was seen in MAU 03/04/2012. For vaginal bleeding and was sent home. Pt states she continued to have bleeding last night. Pt states she was having the same bleeding as yesterday , but this evening bleeding has increased

## 2012-03-06 ENCOUNTER — Encounter (HOSPITAL_COMMUNITY): Payer: Self-pay | Admitting: Anesthesiology

## 2012-03-06 ENCOUNTER — Inpatient Hospital Stay (HOSPITAL_COMMUNITY): Payer: BC Managed Care – PPO | Admitting: Anesthesiology

## 2012-03-06 ENCOUNTER — Encounter (HOSPITAL_COMMUNITY)
Admission: AD | Disposition: A | Discharge status: Home / Self Care | Payer: Self-pay | Source: Ambulatory Visit | Attending: Obstetrics and Gynecology

## 2012-03-06 ENCOUNTER — Encounter (HOSPITAL_COMMUNITY): Payer: Self-pay | Admitting: Family Medicine

## 2012-03-06 DIAGNOSIS — O4590 Premature separation of placenta, unspecified, unspecified trimester: Secondary | ICD-10-CM

## 2012-03-06 DIAGNOSIS — O368390 Maternal care for abnormalities of the fetal heart rate or rhythm, unspecified trimester, not applicable or unspecified: Secondary | ICD-10-CM

## 2012-03-06 DIAGNOSIS — O4690 Antepartum hemorrhage, unspecified, unspecified trimester: Secondary | ICD-10-CM

## 2012-03-06 LAB — CBC
HCT: 27.5 % — ABNORMAL LOW (ref 36.0–46.0)
MCH: 29.9 pg (ref 26.0–34.0)
MCHC: 32.7 g/dL (ref 30.0–36.0)
MCV: 91.4 fL (ref 78.0–100.0)
Platelets: 305 10*3/uL (ref 150–400)
RDW: 13.9 % (ref 11.5–15.5)
WBC: 6.4 10*3/uL (ref 4.0–10.5)

## 2012-03-06 LAB — RAPID URINE DRUG SCREEN, HOSP PERFORMED
Amphetamines: NOT DETECTED
Tetrahydrocannabinol: NOT DETECTED

## 2012-03-06 SURGERY — Surgical Case
Anesthesia: General | Site: Abdomen | Wound class: Clean Contaminated

## 2012-03-06 MED ORDER — SIMETHICONE 80 MG PO CHEW
80.0000 mg | CHEWABLE_TABLET | Freq: Three times a day (TID) | ORAL | Status: DC
  Administered 2012-03-06 – 2012-03-09 (×7): 80 mg via ORAL

## 2012-03-06 MED ORDER — CEFAZOLIN SODIUM 1-5 GM-% IV SOLN
INTRAVENOUS | Status: AC
  Filled 2012-03-06: qty 50

## 2012-03-06 MED ORDER — IBUPROFEN 600 MG PO TABS: 600.0000 mg | ORAL_TABLET | Freq: Four times a day (QID) | ORAL | Status: DC | PRN

## 2012-03-06 MED ORDER — DIPHENHYDRAMINE HCL 25 MG PO CAPS
25.0000 mg | ORAL_CAPSULE | Freq: Four times a day (QID) | ORAL | Status: DC | PRN
  Filled 2012-03-06: qty 1

## 2012-03-06 MED ORDER — BUPIVACAINE HCL (PF) 0.25 % IJ SOLN
INTRAMUSCULAR | Status: DC | PRN
  Administered 2012-03-06: 30 mL

## 2012-03-06 MED ORDER — NALBUPHINE SYRINGE 5 MG/0.5 ML
10.0000 mg | INJECTION | INTRAMUSCULAR | Status: DC | PRN
  Administered 2012-03-06: 10 mg via INTRAVENOUS
  Administered 2012-03-06 (×2): 5 mg via INTRAVENOUS
  Filled 2012-03-06 (×2): qty 0.5
  Filled 2012-03-06: qty 1

## 2012-03-06 MED ORDER — LIDOCAINE HCL (PF) 1 % IJ SOLN: 30.0000 mL | INTRAMUSCULAR | Status: DC | PRN

## 2012-03-06 MED ORDER — ONDANSETRON HCL 4 MG/2ML IJ SOLN: 4.0000 mg | INTRAMUSCULAR | Status: DC | PRN

## 2012-03-06 MED ORDER — SUCCINYLCHOLINE CHLORIDE 20 MG/ML IJ SOLN
INTRAMUSCULAR | Status: DC | PRN
  Administered 2012-03-06: 140 mg via INTRAVENOUS

## 2012-03-06 MED ORDER — HETASTARCH-ELECTROLYTES 6 % IV SOLN
INTRAVENOUS | Status: DC | PRN
  Administered 2012-03-06: 14:00:00 via INTRAVENOUS

## 2012-03-06 MED ORDER — PROPOFOL 10 MG/ML IV EMUL
INTRAVENOUS | Status: AC
  Filled 2012-03-06: qty 20

## 2012-03-06 MED ORDER — SENNOSIDES-DOCUSATE SODIUM 8.6-50 MG PO TABS
2.0000 | ORAL_TABLET | Freq: Every day | ORAL | Status: DC
  Administered 2012-03-06 – 2012-03-08 (×3): 2 via ORAL

## 2012-03-06 MED ORDER — EPHEDRINE 5 MG/ML INJ
INTRAVENOUS | Status: AC
  Filled 2012-03-06: qty 10

## 2012-03-06 MED ORDER — DIPHENHYDRAMINE HCL 12.5 MG/5ML PO ELIX
12.5000 mg | ORAL_SOLUTION | Freq: Four times a day (QID) | ORAL | Status: DC | PRN
  Filled 2012-03-06: qty 5

## 2012-03-06 MED ORDER — MORPHINE SULFATE (PF) 1 MG/ML IV SOLN: INTRAVENOUS | Status: DC

## 2012-03-06 MED ORDER — PHENYLEPHRINE HCL 10 MG/ML IJ SOLN
INTRAMUSCULAR | Status: DC | PRN
  Administered 2012-03-06 (×2): 80 mg via INTRAVENOUS

## 2012-03-06 MED ORDER — MIDAZOLAM HCL 2 MG/2ML IJ SOLN
INTRAMUSCULAR | Status: AC
  Filled 2012-03-06: qty 2

## 2012-03-06 MED ORDER — HETASTARCH-ELECTROLYTES 6 % IV SOLN
INTRAVENOUS | Status: AC
  Filled 2012-03-06: qty 500

## 2012-03-06 MED ORDER — HYDROMORPHONE HCL PF 1 MG/ML IJ SOLN
INTRAMUSCULAR | Status: AC
  Filled 2012-03-06: qty 1

## 2012-03-06 MED ORDER — EPHEDRINE SULFATE 50 MG/ML IJ SOLN
INTRAMUSCULAR | Status: DC | PRN
  Administered 2012-03-06: 25 mg via INTRAVENOUS

## 2012-03-06 MED ORDER — FENTANYL CITRATE 0.05 MG/ML IJ SOLN
INTRAMUSCULAR | Status: AC
  Filled 2012-03-06: qty 5

## 2012-03-06 MED ORDER — LANOLIN HYDROUS EX OINT: 1.0000 "application " | TOPICAL_OINTMENT | CUTANEOUS | Status: DC | PRN

## 2012-03-06 MED ORDER — OXYCODONE-ACETAMINOPHEN 5-325 MG PO TABS
1.0000 | ORAL_TABLET | ORAL | Status: DC | PRN
Start: 2012-03-06 — End: 2012-03-06

## 2012-03-06 MED ORDER — MEASLES, MUMPS & RUBELLA VAC ~~LOC~~ INJ
0.5000 mL | INJECTION | Freq: Once | SUBCUTANEOUS | Status: AC
  Administered 2012-03-09: 0.5 mL via SUBCUTANEOUS
  Filled 2012-03-06: qty 0.5

## 2012-03-06 MED ORDER — WITCH HAZEL-GLYCERIN EX PADS: 1.0000 "application " | MEDICATED_PAD | CUTANEOUS | Status: DC | PRN

## 2012-03-06 MED ORDER — OXYTOCIN BOLUS FROM INFUSION
500.0000 mL | Freq: Once | INTRAVENOUS | Status: DC
  Filled 2012-03-06: qty 500

## 2012-03-06 MED ORDER — LACTATED RINGERS IV SOLN
500.0000 mL | INTRAVENOUS | Status: DC | PRN
  Administered 2012-03-06: 500 mL via INTRAVENOUS

## 2012-03-06 MED ORDER — HYDROMORPHONE HCL PF 1 MG/ML IJ SOLN
0.2500 mg | INTRAMUSCULAR | Status: DC | PRN
  Administered 2012-03-06 (×2): 0.5 mg via INTRAVENOUS

## 2012-03-06 MED ORDER — ZOLPIDEM TARTRATE 5 MG PO TABS: 5.0000 mg | ORAL_TABLET | Freq: Every evening | ORAL | Status: DC | PRN

## 2012-03-06 MED ORDER — OXYTOCIN 10 UNIT/ML IJ SOLN
INTRAMUSCULAR | Status: AC
  Filled 2012-03-06: qty 2

## 2012-03-06 MED ORDER — OXYTOCIN 20 UNITS IN LACTATED RINGERS INFUSION - SIMPLE
INTRAVENOUS | Status: AC
  Filled 2012-03-06: qty 1000

## 2012-03-06 MED ORDER — ONDANSETRON HCL 4 MG/2ML IJ SOLN: 4.0000 mg | Freq: Four times a day (QID) | INTRAMUSCULAR | Status: DC | PRN

## 2012-03-06 MED ORDER — TERBUTALINE SULFATE 1 MG/ML IJ SOLN: 0.2500 mg | Freq: Once | INTRAMUSCULAR | Status: DC | PRN

## 2012-03-06 MED ORDER — HYDROMORPHONE HCL PF 1 MG/ML IJ SOLN
INTRAMUSCULAR | Status: DC | PRN
  Administered 2012-03-06 (×2): 0.5 mg via INTRAVENOUS

## 2012-03-06 MED ORDER — SODIUM CHLORIDE 0.9 % IJ SOLN: 9.0000 mL | INTRAMUSCULAR | Status: DC | PRN

## 2012-03-06 MED ORDER — NALOXONE HCL 0.4 MG/ML IJ SOLN: 0.4000 mg | INTRAMUSCULAR | Status: DC | PRN

## 2012-03-06 MED ORDER — IBUPROFEN 600 MG PO TABS
600.0000 mg | ORAL_TABLET | Freq: Four times a day (QID) | ORAL | Status: DC
  Administered 2012-03-06 – 2012-03-09 (×10): 600 mg via ORAL
  Filled 2012-03-06 (×2): qty 1
  Filled 2012-03-06: qty 2
  Filled 2012-03-06 (×2): qty 1
  Filled 2012-03-06: qty 2
  Filled 2012-03-06 (×3): qty 1

## 2012-03-06 MED ORDER — FENTANYL CITRATE 0.05 MG/ML IJ SOLN
INTRAMUSCULAR | Status: DC | PRN
  Administered 2012-03-06: 50 ug via INTRAVENOUS
  Administered 2012-03-06: 100 ug via INTRAVENOUS
  Administered 2012-03-06 (×2): 50 ug via INTRAVENOUS
  Administered 2012-03-06: 100 ug via INTRAVENOUS

## 2012-03-06 MED ORDER — PRENATAL MULTIVITAMIN CH
1.0000 | ORAL_TABLET | Freq: Every day | ORAL | Status: DC
  Administered 2012-03-08 – 2012-03-09 (×2): 1 via ORAL
  Filled 2012-03-06 (×3): qty 1

## 2012-03-06 MED ORDER — OXYTOCIN 20 UNITS IN LACTATED RINGERS INFUSION - SIMPLE
INTRAVENOUS | Status: DC | PRN
  Administered 2012-03-06: 20 [IU] via INTRAVENOUS

## 2012-03-06 MED ORDER — KETOROLAC TROMETHAMINE 30 MG/ML IJ SOLN
15.0000 mg | Freq: Once | INTRAMUSCULAR | Status: AC | PRN
  Administered 2012-03-06: 30 mg via INTRAVENOUS

## 2012-03-06 MED ORDER — MIDAZOLAM HCL 5 MG/ML IJ SOLN
INTRAMUSCULAR | Status: DC | PRN
  Administered 2012-03-06: 2 mg via INTRAVENOUS

## 2012-03-06 MED ORDER — PHENYLEPHRINE 40 MCG/ML (10ML) SYRINGE FOR IV PUSH (FOR BLOOD PRESSURE SUPPORT)
PREFILLED_SYRINGE | INTRAVENOUS | Status: AC
  Filled 2012-03-06: qty 5

## 2012-03-06 MED ORDER — DIBUCAINE 1 % RE OINT: 1.0000 "application " | TOPICAL_OINTMENT | RECTAL | Status: DC | PRN

## 2012-03-06 MED ORDER — DEXTROSE IN LACTATED RINGERS 5 % IV SOLN
INTRAVENOUS | Status: DC
  Administered 2012-03-06: 21:00:00 via INTRAVENOUS

## 2012-03-06 MED ORDER — PROPOFOL 10 MG/ML IV EMUL
INTRAVENOUS | Status: DC | PRN
  Administered 2012-03-06: 200 mg via INTRAVENOUS

## 2012-03-06 MED ORDER — MISOPROSTOL 25 MCG QUARTER TABLET: 25.0000 ug | ORAL_TABLET | ORAL | Status: DC | PRN

## 2012-03-06 MED ORDER — LACTATED RINGERS IV SOLN
INTRAVENOUS | Status: DC
Start: 2012-03-06 — End: 2012-03-06
  Administered 2012-03-06: 10:00:00 via INTRAVENOUS

## 2012-03-06 MED ORDER — ONDANSETRON HCL 4 MG PO TABS: 4.0000 mg | ORAL_TABLET | ORAL | Status: DC | PRN

## 2012-03-06 MED ORDER — LACTATED RINGERS IV SOLN
INTRAVENOUS | Status: DC | PRN
  Administered 2012-03-06 (×2): via INTRAVENOUS

## 2012-03-06 MED ORDER — FLEET ENEMA 7-19 GM/118ML RE ENEM: 1.0000 | ENEMA | RECTAL | Status: DC | PRN

## 2012-03-06 MED ORDER — OXYTOCIN 20 UNITS IN LACTATED RINGERS INFUSION - SIMPLE
125.0000 mL/h | INTRAVENOUS | Status: AC
  Administered 2012-03-06: 125 mL/h via INTRAVENOUS

## 2012-03-06 MED ORDER — SUCCINYLCHOLINE CHLORIDE 20 MG/ML IJ SOLN
INTRAMUSCULAR | Status: AC
  Filled 2012-03-06: qty 10

## 2012-03-06 MED ORDER — CITRIC ACID-SODIUM CITRATE 334-500 MG/5ML PO SOLN: 30.0000 mL | ORAL | Status: DC | PRN

## 2012-03-06 MED ORDER — CEFAZOLIN SODIUM 1-5 GM-% IV SOLN
INTRAVENOUS | Status: DC | PRN
  Administered 2012-03-06: 1 g via INTRAVENOUS

## 2012-03-06 MED ORDER — OXYCODONE-ACETAMINOPHEN 5-325 MG PO TABS
1.0000 | ORAL_TABLET | ORAL | Status: DC | PRN
  Administered 2012-03-07 – 2012-03-08 (×9): 1 via ORAL
  Filled 2012-03-06 (×10): qty 1

## 2012-03-06 MED ORDER — OXYTOCIN 20 UNITS IN LACTATED RINGERS INFUSION - SIMPLE: 125.0000 mL/h | Freq: Once | INTRAVENOUS | Status: DC

## 2012-03-06 MED ORDER — FENTANYL CITRATE 0.05 MG/ML IJ SOLN
INTRAMUSCULAR | Status: AC
  Filled 2012-03-06: qty 2

## 2012-03-06 MED ORDER — ACETAMINOPHEN 325 MG PO TABS: 650.0000 mg | ORAL_TABLET | ORAL | Status: DC | PRN

## 2012-03-06 MED ORDER — TETANUS-DIPHTH-ACELL PERTUSSIS 5-2.5-18.5 LF-MCG/0.5 IM SUSP
0.5000 mL | Freq: Once | INTRAMUSCULAR | Status: AC
  Administered 2012-03-09: 0.5 mL via INTRAMUSCULAR
  Filled 2012-03-06: qty 0.5

## 2012-03-06 MED ORDER — MENTHOL 3 MG MT LOZG: 1.0000 | LOZENGE | OROMUCOSAL | Status: DC | PRN

## 2012-03-06 MED ORDER — DIPHENHYDRAMINE HCL 50 MG/ML IJ SOLN: 12.5000 mg | Freq: Four times a day (QID) | INTRAMUSCULAR | Status: DC | PRN

## 2012-03-06 MED ORDER — KETOROLAC TROMETHAMINE 30 MG/ML IJ SOLN
INTRAMUSCULAR | Status: AC
  Administered 2012-03-06: 30 mg via INTRAVENOUS
  Filled 2012-03-06: qty 1

## 2012-03-06 MED ORDER — HYDROMORPHONE HCL PF 1 MG/ML IJ SOLN
INTRAMUSCULAR | Status: AC
  Administered 2012-03-06: 0.5 mg via INTRAVENOUS
  Filled 2012-03-06: qty 1

## 2012-03-06 MED ORDER — ONDANSETRON HCL 4 MG/2ML IJ SOLN
INTRAMUSCULAR | Status: DC | PRN
  Administered 2012-03-06: 4 mg via INTRAVENOUS

## 2012-03-06 MED ORDER — SIMETHICONE 80 MG PO CHEW
80.0000 mg | CHEWABLE_TABLET | ORAL | Status: DC | PRN
  Administered 2012-03-08: 80 mg via ORAL

## 2012-03-06 MED ORDER — HYDROMORPHONE HCL PF 1 MG/ML IJ SOLN
0.5000 mg | INTRAMUSCULAR | Status: DC | PRN
  Administered 2012-03-06: 0.5 mg via INTRAVENOUS
  Filled 2012-03-06: qty 1

## 2012-03-06 SURGICAL SUPPLY — 28 items
CHLORAPREP W/TINT 26ML (MISCELLANEOUS) ×2 IMPLANT
CLOTH BEACON ORANGE TIMEOUT ST (SAFETY) ×2 IMPLANT
DRESSING TELFA 8X3 (GAUZE/BANDAGES/DRESSINGS) ×2 IMPLANT
ELECT REM PT RETURN 9FT ADLT (ELECTROSURGICAL) ×2
ELECTRODE REM PT RTRN 9FT ADLT (ELECTROSURGICAL) ×1 IMPLANT
EXTRACTOR VACUUM M CUP 4 TUBE (SUCTIONS) IMPLANT
GAUZE SPONGE 4X4 12PLY STRL LF (GAUZE/BANDAGES/DRESSINGS) ×4 IMPLANT
GLOVE BIOGEL PI IND STRL 7.0 (GLOVE) ×1 IMPLANT
GLOVE BIOGEL PI INDICATOR 7.0 (GLOVE) ×1
GLOVE ECLIPSE 7.0 STRL STRAW (GLOVE) ×4 IMPLANT
GOWN PREVENTION PLUS LG XLONG (DISPOSABLE) ×4 IMPLANT
GOWN PREVENTION PLUS XLARGE (GOWN DISPOSABLE) ×2 IMPLANT
KIT ABG SYR 3ML LUER SLIP (SYRINGE) IMPLANT
NEEDLE HYPO 22GX1.5 SAFETY (NEEDLE) ×2 IMPLANT
NEEDLE HYPO 25X5/8 SAFETYGLIDE (NEEDLE) IMPLANT
NS IRRIG 1000ML POUR BTL (IV SOLUTION) ×2 IMPLANT
PACK C SECTION WH (CUSTOM PROCEDURE TRAY) ×2 IMPLANT
PAD ABD 7.5X8 STRL (GAUZE/BANDAGES/DRESSINGS) IMPLANT
RTRCTR C-SECT PINK 25CM LRG (MISCELLANEOUS) IMPLANT
SLEEVE SCD COMPRESS KNEE MED (MISCELLANEOUS) IMPLANT
STAPLER VISISTAT 35W (STAPLE) IMPLANT
SUT VIC AB 0 CTX 36 (SUTURE) ×3
SUT VIC AB 0 CTX36XBRD ANBCTRL (SUTURE) ×3 IMPLANT
SUT VIC AB 4-0 KS 27 (SUTURE) IMPLANT
SYR 30ML LL (SYRINGE) ×2 IMPLANT
TOWEL OR 17X24 6PK STRL BLUE (TOWEL DISPOSABLE) ×4 IMPLANT
TRAY FOLEY CATH 14FR (SET/KITS/TRAYS/PACK) ×2 IMPLANT
WATER STERILE IRR 1000ML POUR (IV SOLUTION) ×2 IMPLANT

## 2012-03-06 NOTE — Op Note (Signed)
Preoperative Diagnosis:  IUP @ [redacted]w[redacted]d, NRFHR  Postoperative Diagnosis:  Same, foley bulb noted inside edge of placenta  Procedure: Primary emergent low transverse cesarean section  Surgeon: Tinnie Gens, M.D.  Assistant: None  Findings: Viable female infant, APGAR (1 MIN): 6   APGAR (5 MINS): 9   Weight 6 # 9 oz, vertex presentation, LOA position  Estimated blood loss: 1000 cc  Complications: None known  Specimens: Placenta to pathology  Reason for procedure: Briefly, the patient is a 26 y.o. [id number] [redacted]w[redacted]d who came in with bleeding.  Attempt at IOL, included foley bulb ripening which resulted in lots of vaginal bleeding.  FHR dropped and maternal vitals became unstable and pt. Was taken for emergent Cesarean delivery.  Procedure: Patient is a to the OR where general analgesia was administered.  She was prepped and draped in the usual sterile fashion. A knife was then used to make a Pfannenstiel incision. This incision was carried out to underlying fascia which was divided in the midline with the knife. The incision was extended laterally, bluntly.  The rectus was divided in the midline.  The peritoneal cavity was entered bluntly.  Bladder blade placed.  A knife was used to make a low transverse incision on the uterus. This incision was carried down to the amniotic cavity was entered. Fetus was in LOA position and was brought up out of the incision without difficulty. Cord was clamped x 2 and cut. Infant taken to waiting pediatrician.  Cord pH and cord blood was obtained. Placenta was delivered from the uterus.  Uterus was cleaned with dry lap pads. Uterine incision closed with 0 Vicryl suture in a locked running fashion. A second layer of 0 Vicryl in an imbricating fashion was used to achieve hemostasis.  Retractors were removed from the abdomen.  Fascia is closed with 0 Vicryl suture in a running fashion.  Skin closed using 3-0 Vicryl on a Keith needle.  Subcutaneous tissue infused with 30cc  0.25% Marcaine. Benzoin, Steri strips applied, followed by pressure dressing.  All instrument, needle and lap counts were correct x 2.  Patient was awake and taken to PACU stable.  Infant to Newborn Nursery, stable.

## 2012-03-06 NOTE — Anesthesia Postprocedure Evaluation (Signed)
Anesthesia Post Note  Patient: Crystal Figueroa  Procedure(s) Performed: Procedure(s) (LRB): CESAREAN SECTION (N/A)  Anesthesia type: General  Patient location: PACU  Post pain: Pain level controlled  Post assessment: Post-op Vital signs reviewed  Last Vitals:  Filed Vitals:   03/06/12 1600  BP:   Pulse: 79  Temp:   Resp: 19    Post vital signs: Reviewed  Level of consciousness: sedated  Complications: No apparent anesthesia complications

## 2012-03-06 NOTE — Anesthesia Preprocedure Evaluation (Signed)
Anesthesia Evaluation  Patient identified by MRN, date of birth, ID band Patient unresponsive    Reviewed: Allergy & Precautions, H&P , NPO status , Patient's Chart, lab work & pertinent test results, reviewed documented beta blocker date and time , Unable to perform ROS - Chart review only  History of Anesthesia Complications Negative for: history of anesthetic complications  Airway  TM Distance: >3 FB Neck ROM: full   Comment: Not assessed - STAT C/S Dental   Not assessed - STAT C/S:   Pulmonary neg pulmonary ROS,  breath sounds clear to auscultation        Cardiovascular negative cardio ROS  Rhythm:regular Rate:Normal  Hemodynamically unstable   Neuro/Psych  Headaches, negative neurological ROS  negative psych ROS   GI/Hepatic negative GI ROS, Neg liver ROS,   Endo/Other  negative endocrine ROS  Renal/GU negative Renal ROS     Musculoskeletal   Abdominal   Peds  Hematology negative hematology ROS (+)   Anesthesia Other Findings   Reproductive/Obstetrics (+) Pregnancy (bleeding from vagina, BP 45/23, unresponsive except to sternal rub --> STAT C/S)                           Anesthesia Physical Anesthesia Plan  ASA: II and Emergent  Anesthesia Plan: General ETT and Rapid Sequence   Post-op Pain Management:    Induction:   Airway Management Planned:   Additional Equipment:   Intra-op Plan:   Post-operative Plan:   Informed Consent: I have reviewed the patients History and Physical, chart, labs and discussed the procedure including the risks, benefits and alternatives for the proposed anesthesia with the patient or authorized representative who has indicated his/her understanding and acceptance.   Only emergency history available  Plan Discussed with: CRNA and Surgeon  Anesthesia Plan Comments:         Anesthesia Quick Evaluation

## 2012-03-06 NOTE — Progress Notes (Signed)
Crystal Figueroa is a 26 y.o. G4P3003 at [redacted]w[redacted]d by ultrasound admitted for vaginal bleeding with possible abruption. For induction of labor. Complicated by further bleeding on placement of foley bulb. Maternal and fetal vital signs/tracings normal respectively.  Mother tachycardic 105.   Subjective: No complaints.  Objective: BP 108/66  Pulse 105  Temp(Src) 97.7 F (36.5 C) (Oral)  Resp 18  Ht 5\' 2"  (1.575 m)  Wt 195 lb 3.2 oz (88.542 kg)  BMI 35.70 kg/m2  LMP 06/14/2011     FHT:  FHR: 130 bpm, variability: minimal ,  accelerations:  Abscent,  decelerations:  Absent UC:   regular, every 5 minutes SVE:   Dilation: 1 Effacement (%): Thick Foley bulb placed   Labs: Lab Results  Component Value Date   WBC 6.0 03/05/2012   HGB 10.0* 03/05/2012   HCT 30.2* 03/05/2012   MCV 90.1 03/05/2012   PLT 279 03/05/2012    Assessment / Plan: Induction of labor complicated by vaginal bleeding s/p foley bulb placement. Maternal and fetal vitals/tracings reassuing at this time. Frequent checks q 30 minutes. Continuous fetal monitoring Vital signs every 15 minutes.  Bleeding pad counts Pitocin for induction of labor. Continue with foley bulb.   Edd Arbour MD 03/06/2012, 1:53 PM

## 2012-03-06 NOTE — Plan of Care (Signed)
Problem: Consults Goal: Birthing Suites Patient Information Press F2 to bring up selections list   Pt 37-[redacted] weeks EGA     

## 2012-03-06 NOTE — Progress Notes (Signed)
MANJOT BEUMER is a 26 y.o. [phone] at [redacted]w[redacted]d admitted for induction of labor due to heavy vaginal bleeding at term.  Subjective: Pt reports pain with intermittent cramping.  Family member at bedside for support.  Objective: BP 95/51  Pulse 86  Temp(Src) 97.7 F (36.5 C) (Oral)  Resp 20  Ht 5\' 2"  (1.575 m)  Wt 88.542 kg (195 lb 3.2 oz)  BMI 35.70 kg/m2  LMP 06/14/2011      FHT:  FHR: 135 bpm, variability: moderate,  accelerations:  Absent,  decelerations:  Absent, Category I UC:   Irregular, none on Toco, pt reports cramping every 5-10 minutes SVE:   Deferred at this time Observed vaginal bleeding, 1/2 of pad soaked with one golf ball sized clot in last couple of hours  Labs: Lab Results  Component Value Date   WBC 6.0 03/05/2012   HGB 10.0* 03/05/2012   HCT 30.2* 03/05/2012   MCV 90.1 03/05/2012   PLT 279 03/05/2012    Assessment / Plan: Induction of labor due to vaginal bleeding Discussed Foley bulb placement with pt Pt has not eaten since yesterday, desires light meal before induction begins Light meal ordered, plan to place Foley bulb in 1-2 hours  Labor: Progressing normally Preeclampsia:  N/A Fetal Wellbeing:  Category I Pain Control:  Nubain I/D:  n/a Anticipated MOD:  NSVD  LEFTWICH-KIRBY, Saphire Barnhart 03/06/2012, 10:45 AM

## 2012-03-06 NOTE — Plan of Care (Signed)
Problem: Consults Goal: Postpartum Patient Education (See Patient Education module for education specifics.) Outcome: Not Progressing Patient received general anesthesia for STAT emergency c/section. Mom unable to comprehend education information upon admission to [facility].

## 2012-03-06 NOTE — Progress Notes (Signed)
Called to see pt for bleeding.  Bleeding began briskly after insertion of foley bulb.  FHR is completely normal.  Maternal vitals are completely normal. Will begin pitocin and monitor pad count.   Consider early epidural.  Anesthesia aware.  CBC now.
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
## 2012-03-06 NOTE — Transfer of Care (Signed)
Immediate Anesthesia Transfer of Care Note  Patient: Crystal Figueroa  Procedure(s) Performed: Procedure(s) (LRB): CESAREAN SECTION (N/A)  Patient Location: PACU  Anesthesia Type: General  Level of Consciousness: oriented and sedated  Airway & Oxygen Therapy: Patient Spontanous Breathing and Patient connected to nasal cannula oxygen  Post-op Assessment: Report given to PACU RN and Post -op Vital signs reviewed and stable  Post vital signs: stable  Complications: No apparent anesthesia complications

## 2012-03-06 NOTE — Progress Notes (Signed)
Crystal Figueroa is a 26 y.o. G4P3003 at [redacted]w[redacted]d admitted for IOL for vaginal bleeding at term.   Subjective: Pt changed from alert and oriented to slow to respond to verbal cues from RN.  Family member is upset and crying in room.    Objective: BP 108/66  Pulse 105  Temp(Src) 97.7 F (36.5 C) (Oral)  Resp 18  Ht 5\' 2"  (1.575 m)  Wt 88.542 kg (195 lb 3.2 oz)  BMI 35.70 kg/m2  LMP 06/14/2011      FHT: Category I FHR tracing prior to single FHR deceleration to 60s lasting >2 minutes UC:   regular, every 2 to 3 minutes SVE:   Deferred Dr Shawnie Pons in room following placement of Foley bulb by Sharen Counter, CNM, to evaluate bleeding.  FHR Category I tracing at that time and maternal VS normal.  Plan to continue induction and monitor bleeding/pad count every 30 minutes with Q 15 minute VS.  With FHR deceleration and maternal LOC changes, Dr Shawnie Pons returned to room, stat C/S called and pt transported to [facility].   2 units PRBC ordered.   Labs: Lab Results  Component Value Date   WBC 6.0 03/05/2012   HGB 10.0* 03/05/2012   HCT 30.2* 03/05/2012   MCV 90.1 03/05/2012   PLT 279 03/05/2012    Assessment / Plan: Stat C/S under general anesthesia  Mother and baby stable in OR and discussion of reasons for emergency cesarean section and reassurance provided for family members.   Crystal Figueroa, Crystal Figueroa 03/06/2012, 2:13 PM

## 2012-03-07 ENCOUNTER — Encounter (HOSPITAL_COMMUNITY): Payer: Self-pay | Admitting: Family Medicine

## 2012-03-07 LAB — CBC
Hemoglobin: 7.5 g/dL — ABNORMAL LOW (ref 12.0–15.0)
MCH: 30.2 pg (ref 26.0–34.0)
MCV: 91.5 fL (ref 78.0–100.0)
Platelets: 214 10*3/uL (ref 150–400)
Platelets: 212 10*3/uL (ref 150–400)
RBC: 1.89 MIL/uL — ABNORMAL LOW (ref 3.87–5.11)
RBC: 2.48 MIL/uL — ABNORMAL LOW (ref 3.87–5.11)
WBC: 10.1 10*3/uL (ref 4.0–10.5)
WBC: 11.7 10*3/uL — ABNORMAL HIGH (ref 4.0–10.5)

## 2012-03-07 LAB — PREPARE RBC (CROSSMATCH)

## 2012-03-07 MED ORDER — FUROSEMIDE 10 MG/ML IJ SOLN
20.0000 mg | Freq: Once | INTRAMUSCULAR | Status: AC
  Administered 2012-03-07: 20 mg via INTRAVENOUS
  Filled 2012-03-07: qty 2

## 2012-03-07 MED ORDER — SODIUM CHLORIDE 0.9 % IV BOLUS (SEPSIS): 1000.0000 mL | Freq: Once | INTRAVENOUS | Status: DC

## 2012-03-07 MED ORDER — ACETAMINOPHEN 325 MG PO TABS
650.0000 mg | ORAL_TABLET | Freq: Once | ORAL | Status: AC
  Administered 2012-03-07: 650 mg via ORAL
  Filled 2012-03-07: qty 2

## 2012-03-07 MED ORDER — DIPHENHYDRAMINE HCL 25 MG PO CAPS
25.0000 mg | ORAL_CAPSULE | Freq: Once | ORAL | Status: AC
  Administered 2012-03-07: 25 mg via ORAL

## 2012-03-07 NOTE — Anesthesia Postprocedure Evaluation (Signed)
  Anesthesia Post-op Note  Patient: Crystal Figueroa  Procedure(s) Performed: Procedure(s) (LRB): CESAREAN SECTION (N/A)  Patient Location: Mother/Baby  Anesthesia Type: General  Level of Consciousness: awake  Airway and Oxygen Therapy: Patient Spontanous Breathing  Post-op Pain: mild  Post-op Assessment: Patient's Cardiovascular Status Stable and Respiratory Function Stable  Post-op Vital Signs: stable  Complications: No apparent anesthesia complications

## 2012-03-07 NOTE — Progress Notes (Signed)
Attending Note  Called to evaluate patient with history of postoperative anemia, abdominal pain, and tachycardia.  On evaluation, patient  Noted to be sitting up in bed with legs hanging over edge of bed, talking to her family and friends. She does report abdominal pain, helped by placing a pillow on her abdomen for support.  She has tolerated regular diet without any problems.  No other symptoms. Of note, patient received two units of pRBCs today, post transfusion CBC to be drawn around 2000  Temp:  [97.5 F (36.4 C)-98.4 F (36.9 C)] 98.3 F (36.8 C) (04/18 1605) Pulse Rate:  [76-114] 114  (04/18 1605) Resp:  [18-24] 20  (04/18 1605) BP: (85-102)/(56-65) 88/57 mmHg (04/18 1605) SpO2:  [98 %-100 %] 98 % (04/18 0900) I/O last 3 completed shifts: In: 4010 [P.O.:360; I.V.:3150; IV Piggyback:500] Out: 3350 [Urine:1650; Blood:1700] Gen: NAD Abdomen: Soft, hypoactive BS, non-distended, moderate tenderness to palpation, dressing still in place, no drainage noted on dressing Extremities: 1+ edema bilaterally  Assessment and Plan: No signs/symptoms of active bleeding.  Patient is anemic which is the etiology for her tachycardia. Good urine output.  Will follow up CBC and determine the need for further transfusion. Will continue close observation.  Jaynie Collins, M.D. 03/07/2012 6:06 PM

## 2012-03-07 NOTE — Progress Notes (Signed)
Subjective: Postpartum Day 1: emergency Cesarean Delivery for abruption/vaginal bleeding, non-reassuring fetal monitoring Patient reports incisional pain and no problems voiding.   No flatus, stool.  Objective: Vital signs in last 24 hours: Temp:  [97.4 F (36.3 C)-98.1 F (36.7 C)] 98.1 F (36.7 C) (04/18 0600) Pulse Rate:  [72-110] 81  (04/18 0600) Resp:  [16-22] 18  (04/18 0600) BP: (45-110)/(23-73) 99/64 mmHg (04/18 0600) SpO2:  [95 %-100 %] 98 % (04/18 0600)  Physical Exam:  General: alert, no distress and good color Lochia: appropriate Uterine Fundus: firm Incision: dressing CDI. No drainage.  DVT Evaluation: No evidence of DVT seen on physical exam.   Basename 03/06/12 1355 03/05/12 1935  HGB 9.0* 10.0*  HCT 27.5* 30.2*    Assessment/Plan: Status post Cesarean section. Doing well postoperatively.  Continue current care. Patient requesting Tubal Ligation. Breast feeding.  CBC today  ORTON,JONATHAN 03/07/2012, 7:36 AM   I have seen and examined this patient and agree the above assessment. CRESENZO-DISHMAN,Earnesteen Birnie 03/07/2012 7:42 AM

## 2012-03-07 NOTE — Progress Notes (Signed)
Nursing Note: Consulted Dr. Rivka Safer regarding patient condition. After receiving 2 units of PRBCs during today, patient remains very lethargic, BPs remain low, heart rate mildly tachycardic, and affect very flat. Pain control difficult to obtain.  Upon assessment the patient's abdomen remains very tender to palpation. Dr. Rivka Safer instructed to administer 1000cc bolus of NS. Will continue to monitor patient condition.   Forrest Moron RN

## 2012-03-07 NOTE — Progress Notes (Signed)
Maylon Cos CNM notified of 2000 CBC results and bp 99/60; hr 108; temp 99; sat 98%.  Ordered to saline lock IV.

## 2012-03-07 NOTE — Addendum Note (Signed)
Addendum  created 03/07/12 0742 by Renford Dills, CRNA   Modules edited:Notes Section

## 2012-03-08 NOTE — Progress Notes (Signed)
Post Partum Day 2 Subjective: no complaints, up ad lib,  tolerating PO, + flatus and No BM, Denies lightheadedness or syncope. Foley catheter in place  Objective: Blood pressure 93/63, pulse 106, temperature 98.2 F (36.8 C), temperature source Oral, resp. rate 20, height 5\' 2"  (1.575 m), weight 88.542 kg (195 lb 3.2 oz), last menstrual period 06/14/2011, SpO2 98.00%, unknown if currently breastfeeding.  Physical Exam:  General: alert, cooperative, appears stated age and no distress Lochia: appropriate Uterine Fundus: firm Incision: Dressings c/d/i DVT Evaluation: No evidence of DVT seen on physical exam.   Basename 03/07/12 2000 03/07/12 0820  HGB 7.5* 5.7*  HCT 22.5* 17.3*    Assessment/Plan: Plan for discharge tomorrow, Breastfeeding and Lactation consult - Closely monitor vs and symptoms for anemia   LOS: 3 days   Crystal Figueroa 03/08/2012, 7:47 AM

## 2012-03-08 NOTE — Progress Notes (Signed)
Patient seen and examined.  Agree with above.
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
## 2012-03-09 LAB — TYPE AND SCREEN
Unit division: 0
Unit division: 0
Unit division: 0
Unit division: 0
Unit division: 0

## 2012-03-09 MED ORDER — OXYCODONE-ACETAMINOPHEN 5-325 MG PO TABS: 1.0000 | ORAL_TABLET | Freq: Four times a day (QID) | ORAL | Status: AC | PRN

## 2012-03-09 MED ORDER — IBUPROFEN 600 MG PO TABS: 600.0000 mg | ORAL_TABLET | Freq: Three times a day (TID) | ORAL | Status: AC | PRN

## 2012-03-09 MED ORDER — INTEGRA F 125-1 MG PO CAPS: 1.0000 | ORAL_CAPSULE | Freq: Every day | ORAL | Status: DC

## 2012-03-09 NOTE — Discharge Instructions (Signed)

## 2012-03-09 NOTE — Discharge Summary (Addendum)
  Obstetric Discharge Summary Reason for Admission: induction of labor and vaginal bleeding and placental abruption Prenatal Procedures: ultrasound Intrapartum Procedures: emergency C-Section, Foley bulb placement  Postpartum Procedures: transfusion 2 units of PRBC's Complications-Operative and Postpartum: no operative or post-operative complications. Hemoglobin  Date Value Range Status  03/07/2012 7.5* 12.0-15.0 (g/dL) Final     REPEATED TO VERIFY     POST TRANSFUSION SPECIMEN     HCT  Date Value Range Status  03/07/2012 22.5* 36.0-46.0 (%) Final    Physical Exam:  General: alert, cooperative and no distress Lochia: appropriate Uterine Fundus: firm Incision: clean dry and intact, dressings removed, steri-strips in place.  DVT Evaluation: No evidence of DVT seen on physical exam. Walking comfortably  Discharge Diagnoses: Antepartum bleeding and abruption, emergency c-section for non-reassuring fetal tracings and maternal distress, acute blood loss  Discharge Information: Date: 03/09/2012 Activity: unrestricted Diet: routine Medications: Ibuprofen and Percocet Condition: improved Instructions: refer to practice specific booklet Discharge to: home Follow-up Information    Schedule an appointment as soon as possible for a visit in 6 weeks to follow up.       Patient plans to be abstinent for the next 6 weeks. She plans for a tubal ligation in 6 weeks. She will call for an appointment.   Newborn Data: Live born female  Birth Weight: 6 lb 9.8 oz (3000 g) APGAR: 6, 9  Home with mother.  Edd Arbour MD 03/09/2012, 7:48 AM

## 2012-03-09 NOTE — Discharge Summary (Signed)
Pt assessed and examined.  Reports improvement in symptoms after transfusions.  Desires postpartum sterilization.  Pt informed will also be started on Integra (iron) for anemia postpartum.  [facility]

## 2012-03-11 NOTE — H&P (Signed)
Agree with above note.  Crystal Figueroa 03/11/2012 9:35 AM

## 2012-03-22 ENCOUNTER — Inpatient Hospital Stay (HOSPITAL_COMMUNITY)
Admission: AD | Admit: 2012-03-22 | Discharge: 2012-03-22 | Disposition: A | Discharge status: Home / Self Care | Payer: BC Managed Care – PPO | Source: Ambulatory Visit | Attending: Obstetrics & Gynecology | Admitting: Obstetrics & Gynecology

## 2012-03-22 DIAGNOSIS — IMO0002 Reserved for concepts with insufficient information to code with codable children: Secondary | ICD-10-CM

## 2012-03-22 DIAGNOSIS — T8189XA Other complications of procedures, not elsewhere classified, initial encounter: Secondary | ICD-10-CM

## 2012-03-22 DIAGNOSIS — O909 Complication of the puerperium, unspecified: Secondary | ICD-10-CM | POA: Insufficient documentation

## 2012-03-22 LAB — URINALYSIS, ROUTINE W REFLEX MICROSCOPIC
Glucose, UA: NEGATIVE mg/dL
Leukocytes, UA: NEGATIVE
Nitrite: NEGATIVE
Specific Gravity, Urine: 1.01 (ref 1.005–1.030)
pH: 6.5 (ref 5.0–8.0)

## 2012-03-22 NOTE — MAU Provider Note (Signed)
History     CSN: [id number]  Arrival date and time: 03/22/12 2053   First Provider Initiated Contact with Patient 03/22/12 2154     26 [id number] 2 weeks postpartum Chief Complaint  Patient presents with  . Wound Check  . Abdominal Pain   HPI Pt presents with incisional drainage starting this morning from her LTCS incision.  She denies fever/chills, change in abdominal tenderness, h/a, dizziness, or urinary symptoms.   OB History    Grav Para Term Preterm Abortions TAB SAB Ect Mult Living   4 4 4  0  0 0 0 0 4      Past Medical History  Diagnosis Date  . Headache     Past Surgical History  Procedure Date  . No past surgeries   . Cesarean section 03/06/2012    Procedure: CESAREAN SECTION;  Surgeon: Reva Bores, MD;  Location: [facility];  Service: Gynecology;  Laterality: N/A;    Family History  Problem Relation Age of Onset  . Anesthesia problems Neg Hx     History  Substance Use Topics  . Smoking status: Never Smoker   . Smokeless tobacco: Never Used  . Alcohol Use: No    Allergies: No Known Allergies  Prescriptions prior to admission  Medication Sig Dispense Refill  . Fe Fum-FePoly-FA-Vit C-Vit B3 (INTEGRA F) 125-1 MG CAPS Take 1 tablet by mouth daily.  30 capsule  1  . ibuprofen (ADVIL,MOTRIN) 600 MG tablet Take 600 mg by mouth as needed. Used for abdominal pain.      . Prenatal Vit-Fe Fumarate-FA (PRENATAL MULTIVITAMIN) TABS Take 1 tablet by mouth at bedtime.         Review of Systems  Constitutional: Negative for fever, chills and malaise/fatigue.  Eyes: Negative for blurred vision.  Respiratory: Negative for cough and shortness of breath.   Cardiovascular: Negative for chest pain.  Gastrointestinal: Negative for heartburn, nausea and vomiting.  Genitourinary: Negative for dysuria, urgency and frequency.  Musculoskeletal: Negative.   Neurological: Negative for dizziness and headaches.  Psychiatric/Behavioral: Negative for depression.   Physical  Exam   Blood pressure 98/54, pulse 66, temperature 98.9 F (37.2 C), temperature source Oral, resp. rate 18, height 5' 1.75" (1.568 m), weight 87.771 kg (193 lb 8 oz), currently breastfeeding.  Physical Exam  Nursing note and vitals reviewed. Constitutional: She is oriented to person, place, and time. She appears well-developed and well-nourished.  Neck: Normal range of motion.  Cardiovascular: Normal rate.   Respiratory: Effort normal.  GI: Soft.  Musculoskeletal: Normal range of motion.  Neurological: She is alert and oriented to person, place, and time.  Skin: Skin is warm and dry.  Psychiatric: She has a normal mood and affect. Her behavior is normal. Judgment and thought content normal.   Incision healing well with no edema or erythema along incision site except for slight pink area around left incisional margin, with 1 cm area of superficial skin layer excoriated and seeping small amount clear fluid.   No opening of incision noted, unable to insert cotton swab into open skin area because it is too shallow.  Small gauze pad applied to open skin area and only scant drops of clear fluid noted on pad in 15-20 minutes.   Nolene Bernheim, NP, examined incision site and agrees with above findings.  MAU Course  Procedures  Assessment and Plan  Incisional irritation  D/C home with infection precautions Reassurance provided of normal healing process  Encouraged pt to alternate between allowing incisional  area to dry to open air and using clean gauze or cloth inside front of underwear to absorb drainage F/U as scheduled for postpartum visit Return to MAU as needed  LEFTWICH-KIRBY, Ryer Asato 03/22/2012, 10:34 PM

## 2012-03-22 NOTE — Discharge Instructions (Signed)
Incision Care  An incision is when a surgeon cuts into your body tissues. After surgery, the incision needs to be cared for properly to prevent infection.   HOME CARE INSTRUCTIONS   · Take all medicine as directed by your caregiver. Only take over-the-counter or prescription medicines for pain, discomfort, or fever as directed by your caregiver.  · Do not remove your bandage (dressing) or get your incision wet until your surgeon gives you permission. In the event that your dressing becomes wet, dirty, or starts to smell, change the dressing and call your surgeon for instructions as soon as possible.  · Take showers. Do not take tub baths, swim, or do anything that may soak the wound until it is healed.  · Resume your normal diet and activities as directed or allowed.  · Avoid lifting any weight until you are instructed otherwise.  · Use anti-itch antihistamine medicine as directed by your caregiver. The wound may itch when it is healing. Do not pick or scratch at the wound.  · Follow up with your caregiver for stitch (suture) or staple removal as directed.  · Drink enough fluids to keep your urine clear or pale yellow.  SEEK MEDICAL CARE IF:   · You have redness, swelling, or increasing pain in the wound that is not controlled with medicine.  · You have drainage, blood, or pus coming from the wound that lasts longer than 1 day.  · You develop muscle aches, chills, or a general ill feeling.  · You notice a bad smell coming from the wound or dressing.  · Your wound edges separate after the sutures, staples, or skin adhesive strips have been removed.  · You develop persistent nausea or vomiting.  SEEK IMMEDIATE MEDICAL CARE IF:   · You have a fever.  · You develop a rash.  · You develop dizzy episodes or faint while standing.  · You have difficulty breathing.  · You develop any reaction or side effects to medicine given.  MAKE SURE YOU:   · Understand these instructions.  · Will watch your condition.  · Will get help  right away if you are not doing well or get worse.  Document Released: 05/26/2005 Document Revised: 10/26/2011 Document Reviewed: 03/12/2011  [employer]® Patient Information ©2012 [employer].

## 2012-03-22 NOTE — MAU Note (Signed)
L side of incision area of concern. Scant amt oozing from edges of incision.

## 2012-03-22 NOTE — MAU Note (Signed)
Pt states, " A bump came up and burst this morning on the left side of my incision, and I am hurting all the way across my low abdomen. I had a c/s 03-06-12."

## 2012-03-23 NOTE — MAU Provider Note (Signed)
Attestation of Attending Supervision of Advanced Practitioner: Evaluation and management procedures were performed by the [facility] Fellow/PA/CNM/NP under my supervision and collaboration. Chart reviewed, and agree with management and plan.  Jaynie Collins, M.D. 03/23/2012 2:22 AM

## 2012-04-03 NOTE — Discharge Summary (Signed)
I examined pt and agree with documentation above and resident plan of care. MUHAMMAD,Jhostin Epps  

## 2014-09-21 ENCOUNTER — Encounter (HOSPITAL_COMMUNITY): Payer: Self-pay | Admitting: Family Medicine

## 2015-05-14 ENCOUNTER — Encounter (HOSPITAL_COMMUNITY): Payer: Self-pay | Admitting: *Deleted

## 2015-05-14 ENCOUNTER — Emergency Department (HOSPITAL_COMMUNITY)
Admission: EM | Admit: 2015-05-14 | Discharge: 2015-05-15 | Disposition: A | Discharge status: Home / Self Care | Payer: BLUE CROSS/BLUE SHIELD | Attending: Emergency Medicine | Admitting: Emergency Medicine

## 2015-05-14 DIAGNOSIS — J029 Acute pharyngitis, unspecified: Secondary | ICD-10-CM | POA: Diagnosis present

## 2015-05-14 DIAGNOSIS — J020 Streptococcal pharyngitis: Secondary | ICD-10-CM | POA: Diagnosis not present

## 2015-05-14 LAB — RAPID STREP SCREEN (MED CTR MEBANE ONLY): STREPTOCOCCUS, GROUP A SCREEN (DIRECT): POSITIVE — AB

## 2015-05-14 MED ORDER — LIDOCAINE VISCOUS 2 % MT SOLN
15.0000 mL | Freq: Once | OROMUCOSAL | Status: AC
  Administered 2015-05-14: 15 mL via OROMUCOSAL
  Filled 2015-05-14: qty 15

## 2015-05-14 MED ORDER — ACETAMINOPHEN 325 MG PO TABS
650.0000 mg | ORAL_TABLET | Freq: Four times a day (QID) | ORAL | Status: DC | PRN
  Administered 2015-05-14: 650 mg via ORAL

## 2015-05-14 MED ORDER — ACETAMINOPHEN 325 MG PO TABS
ORAL_TABLET | ORAL | Status: AC
  Filled 2015-05-14: qty 2

## 2015-05-14 MED ORDER — PENICILLIN G BENZATHINE 1200000 UNIT/2ML IM SUSP
1.2000 10*6.[IU] | Freq: Once | INTRAMUSCULAR | Status: AC
  Administered 2015-05-14: 1.2 10*6.[IU] via INTRAMUSCULAR
  Filled 2015-05-14: qty 2

## 2015-05-14 NOTE — ED Provider Notes (Signed)
CSN: [id number]     Arrival date & time 05/14/15  2210 History  This chart was scribed for Santiago Glad, PA-C, working with Toy Cookey, MD by Chestine Spore, ED Scribe. The patient was seen in room TR07C/TR07C at 11:04 PM.    Chief Complaint  Patient presents with  . Fever  . Sore Throat  . Generalized Body Aches      The history is provided by the patient. No language interpreter was used.    HPI Comments: Crystal Figueroa is a 29 y.o. female who presents to the Emergency Department complaining of fever onset this afternoon. She states that she is having associated symptoms of worsening sore throat, generalized body aches, HA, and mild productive cough. She states that she has tried tylenol with no relief for her symptoms. She denies SOB, neck pin, neck stiffness, n/v, dysuria, and any other symptoms.  She has a history of Strep throat.     Past Medical History  Diagnosis Date  . SMOLMBEM(754.4)    Past Surgical History  Procedure Laterality Date  . No past surgeries    . Cesarean section  03/06/2012    Procedure: CESAREAN SECTION;  Surgeon: Reva Bores, MD;  Location: [facility];  Service: Gynecology;  Laterality: N/A;   Family History  Problem Relation Age of Onset  . Anesthesia problems Neg Hx    History  Substance Use Topics  . Smoking status: Never Smoker   . Smokeless tobacco: Never Used  . Alcohol Use: No   OB History    Gravida Para Term Preterm AB TAB SAB Ectopic Multiple Living   4 4 4  0  0 0 0 0 4     Review of Systems  Constitutional: Positive for fever.  HENT: Positive for sore throat.   Respiratory: Positive for cough. Negative for shortness of breath.   Gastrointestinal: Negative for nausea and vomiting.  Genitourinary: Negative for dysuria.  Musculoskeletal: Positive for myalgias (generalized). Negative for neck pain and neck stiffness.  Neurological: Positive for headaches.      Allergies  Review of patient's allergies indicates no known  allergies.  Home Medications   Prior to Admission medications   Medication Sig Start Date End Date Taking? Authorizing Provider  ibuprofen (ADVIL,MOTRIN) 600 MG tablet Take 600 mg by mouth as needed. Used for abdominal pain.   Yes Historical Provider, MD   BP 122/65 mmHg  Pulse 118  Temp(Src) 102.2 F (39 C) (Oral)  Resp 20  SpO2 100% Physical Exam  Constitutional: She is oriented to person, place, and time. She appears well-developed and well-nourished. No distress.  HENT:  Head: Normocephalic and atraumatic.  Right Ear: Tympanic membrane and ear canal normal.  Left Ear: Tympanic membrane and ear canal normal.  Mouth/Throat: Uvula is midline. No trismus in the jaw. No uvula swelling. Oropharyngeal exudate, posterior oropharyngeal edema and posterior oropharyngeal erythema present.  Mild bilateral tonsillar enlargement with exudate. Normal voice phonation. Handling secretion well, no drooling.  Eyes: EOM are normal.  Neck: Normal range of motion. Neck supple. No tracheal deviation present.  Cardiovascular: Normal rate, regular rhythm and normal heart sounds.  Exam reveals no gallop and no friction rub.   No murmur heard. Pulmonary/Chest: Effort normal and breath sounds normal. No respiratory distress. She has no wheezes. She has no rales.  Musculoskeletal: Normal range of motion.  Neurological: She is alert and oriented to person, place, and time.  Skin: Skin is warm and dry.  Psychiatric: She has a  normal mood and affect. Her behavior is normal.  Nursing note and vitals reviewed.   ED Course  Procedures (including critical care time) DIAGNOSTIC STUDIES: Oxygen Saturation is 100% on RA, nl by my interpretation.    COORDINATION OF CARE: 11:08 PM-Discussed treatment plan which includes rapid strep screen and culture with pt at bedside and pt agreed to plan.   Labs Review Labs Reviewed  RAPID STREP SCREEN (NOT AT [facility]) - Abnormal; Notable for the following:    Streptococcus,  Group A Screen (Direct) POSITIVE (*)    All other components within normal limits    Imaging Review No results found.   EKG Interpretation None      MDM   Final diagnoses:  None   Patient presents today with a sore throat onset earlier today.  Rapid strep is positive for strep.  No signs of PTA or Retropharyngeal Abscess at this time.  Patient given IM Bicillin in the ED.  Stable for discharge.  Return precautions given. I personally performed the services described in this documentation, which was scribed in my presence. The recorded information has been reviewed and is accurate.    Santiago Glad, PA-C 05/15/15 1610  Toy Cookey, MD 05/15/15 1124

## 2015-05-15 MED ORDER — LIDOCAINE VISCOUS 2 % MT SOLN: 20.0000 mL | OROMUCOSAL | Status: DC | PRN

## 2018-12-19 ENCOUNTER — Encounter (HOSPITAL_COMMUNITY): Payer: Self-pay | Admitting: Emergency Medicine

## 2018-12-19 ENCOUNTER — Ambulatory Visit (HOSPITAL_COMMUNITY)
Admission: EM | Admit: 2018-12-19 | Discharge: 2018-12-19 | Disposition: A | Discharge status: Home / Self Care | Payer: BLUE CROSS/BLUE SHIELD | Attending: Family Medicine | Admitting: Family Medicine

## 2018-12-19 DIAGNOSIS — G43019 Migraine without aura, intractable, without status migrainosus: Secondary | ICD-10-CM | POA: Insufficient documentation

## 2018-12-19 DIAGNOSIS — R52 Pain, unspecified: Secondary | ICD-10-CM | POA: Diagnosis not present

## 2018-12-19 MED ORDER — CYCLOBENZAPRINE HCL 5 MG PO TABS: 5.0000 mg | ORAL_TABLET | Freq: Three times a day (TID) | ORAL | 0 refills | Status: DC | PRN

## 2018-12-19 MED ORDER — IBUPROFEN 800 MG PO TABS: 800.0000 mg | ORAL_TABLET | Freq: Three times a day (TID) | ORAL | 0 refills | Status: DC

## 2018-12-19 MED ORDER — KETOROLAC TROMETHAMINE 60 MG/2ML IM SOLN
60.0000 mg | Freq: Once | INTRAMUSCULAR | Status: AC
  Administered 2018-12-19: 60 mg via INTRAMUSCULAR

## 2018-12-19 MED ORDER — KETOROLAC TROMETHAMINE 60 MG/2ML IM SOLN
INTRAMUSCULAR | Status: AC
  Filled 2018-12-19: qty 2
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
## 2018-12-19 NOTE — ED Triage Notes (Signed)
Pt c/o headache, body ache since yesterday.

## 2018-12-19 NOTE — Discharge Instructions (Addendum)
Drink more water Take the ibuprofen 3 x a day with food as needed headache and body pain Take the cyclobenzaprine as needed muscle pain This is useful at bedtime Return if not improving in a couple of days

## 2019-06-02 ENCOUNTER — Emergency Department (HOSPITAL_COMMUNITY): Payer: BLUE CROSS/BLUE SHIELD

## 2019-06-02 ENCOUNTER — Encounter (HOSPITAL_COMMUNITY): Payer: Self-pay | Admitting: Emergency Medicine

## 2019-06-02 ENCOUNTER — Other Ambulatory Visit: Payer: Self-pay

## 2019-06-02 ENCOUNTER — Emergency Department (HOSPITAL_COMMUNITY)
Admission: EM | Admit: 2019-06-02 | Discharge: 2019-06-02 | Disposition: A | Discharge status: Home / Self Care | Payer: BLUE CROSS/BLUE SHIELD | Attending: Emergency Medicine | Admitting: Emergency Medicine

## 2019-06-02 DIAGNOSIS — Y92410 Unspecified street and highway as the place of occurrence of the external cause: Secondary | ICD-10-CM | POA: Insufficient documentation

## 2019-06-02 DIAGNOSIS — Y998 Other external cause status: Secondary | ICD-10-CM | POA: Insufficient documentation

## 2019-06-02 DIAGNOSIS — S0990XA Unspecified injury of head, initial encounter: Secondary | ICD-10-CM | POA: Diagnosis present

## 2019-06-02 DIAGNOSIS — Y9389 Activity, other specified: Secondary | ICD-10-CM | POA: Diagnosis not present

## 2019-06-02 DIAGNOSIS — S0003XA Contusion of scalp, initial encounter: Secondary | ICD-10-CM

## 2019-06-02 DIAGNOSIS — S39012A Strain of muscle, fascia and tendon of lower back, initial encounter: Secondary | ICD-10-CM | POA: Insufficient documentation

## 2019-06-02 LAB — I-STAT BETA HCG BLOOD, ED (MC, WL, AP ONLY): I-stat hCG, quantitative: <5 m[IU]/mL (ref <5)

## 2019-06-02 MED ORDER — IBUPROFEN 600 MG PO TABS: 600.0000 mg | ORAL_TABLET | Freq: Four times a day (QID) | ORAL | 0 refills | Status: DC | PRN

## 2019-06-02 MED ORDER — DIAZEPAM 5 MG PO TABS: 5.0000 mg | ORAL_TABLET | Freq: Two times a day (BID) | ORAL | 0 refills | Status: DC

## 2019-06-02 MED ORDER — IBUPROFEN 400 MG PO TABS
600.0000 mg | ORAL_TABLET | Freq: Once | ORAL | Status: AC
  Administered 2019-06-02: 600 mg via ORAL
  Filled 2019-06-02: qty 1

## 2019-06-02 MED ORDER — HYDROCODONE-ACETAMINOPHEN 5-325 MG PO TABS: 1.0000 | ORAL_TABLET | ORAL | 0 refills | Status: DC | PRN

## 2019-06-02 MED ORDER — HYDROCODONE-ACETAMINOPHEN 5-325 MG PO TABS
1.0000 | ORAL_TABLET | Freq: Once | ORAL | Status: AC
  Administered 2019-06-02: 1 via ORAL
  Filled 2019-06-02: qty 1
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
## 2019-06-02 NOTE — ED Notes (Signed)
Pt transported to xray/ct 

## 2019-06-04 ENCOUNTER — Encounter (HOSPITAL_COMMUNITY): Payer: Self-pay

## 2019-06-04 ENCOUNTER — Other Ambulatory Visit: Payer: Self-pay

## 2019-06-04 ENCOUNTER — Ambulatory Visit (HOSPITAL_COMMUNITY)
Admission: EM | Admit: 2019-06-04 | Discharge: 2019-06-04 | Disposition: A | Discharge status: Home / Self Care | Payer: BLUE CROSS/BLUE SHIELD | Attending: Family Medicine | Admitting: Family Medicine

## 2019-06-04 ENCOUNTER — Telehealth (HOSPITAL_COMMUNITY): Payer: Self-pay

## 2019-06-04 DIAGNOSIS — M545 Low back pain, unspecified: Secondary | ICD-10-CM

## 2019-06-04 DIAGNOSIS — M7918 Myalgia, other site: Secondary | ICD-10-CM | POA: Diagnosis not present

## 2019-06-04 MED ORDER — PREDNISONE 50 MG PO TABS: 50.0000 mg | ORAL_TABLET | Freq: Every day | ORAL | 0 refills | Status: AC

## 2019-06-04 MED ORDER — CYCLOBENZAPRINE HCL 5 MG PO TABS: 5.0000 mg | ORAL_TABLET | Freq: Two times a day (BID) | ORAL | 0 refills | Status: DC | PRN
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
## 2019-06-04 NOTE — Telephone Encounter (Signed)
Pt. Called and requested a new work note concerning restrictions.  I explained to pt. That her work note did not have any restrictions  Specified.  Pt. Also reports have  Lower back pain, lt. Arm pain and numbness and these are 2 new symptoms. I  Discussed with pt. That having 2 new symptoms develop since her visit, that she would need to come back for an evaluation.  She verbalized understanding and will be back to be seen by EDP.
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
## 2019-06-18 ENCOUNTER — Ambulatory Visit (HOSPITAL_COMMUNITY)
Admission: EM | Admit: 2019-06-18 | Discharge: 2019-06-18 | Disposition: A | Discharge status: Home / Self Care | Payer: BLUE CROSS/BLUE SHIELD | Attending: Emergency Medicine | Admitting: Emergency Medicine

## 2019-06-18 ENCOUNTER — Encounter (HOSPITAL_COMMUNITY): Payer: Self-pay

## 2019-06-18 ENCOUNTER — Other Ambulatory Visit: Payer: Self-pay

## 2019-06-18 DIAGNOSIS — G43009 Migraine without aura, not intractable, without status migrainosus: Secondary | ICD-10-CM | POA: Insufficient documentation

## 2019-06-18 DIAGNOSIS — Z20828 Contact with and (suspected) exposure to other viral communicable diseases: Secondary | ICD-10-CM | POA: Diagnosis not present

## 2019-06-18 DIAGNOSIS — M5441 Lumbago with sciatica, right side: Secondary | ICD-10-CM | POA: Diagnosis present

## 2019-06-18 DIAGNOSIS — S161XXD Strain of muscle, fascia and tendon at neck level, subsequent encounter: Secondary | ICD-10-CM | POA: Insufficient documentation

## 2019-06-18 MED ORDER — NAPROXEN 500 MG PO TABS: 500.0000 mg | ORAL_TABLET | Freq: Two times a day (BID) | ORAL | 0 refills | Status: DC

## 2019-06-18 MED ORDER — DEXAMETHASONE SODIUM PHOSPHATE 10 MG/ML IJ SOLN
10.0000 mg | Freq: Once | INTRAMUSCULAR | Status: AC
  Administered 2019-06-18: 10 mg via INTRAMUSCULAR

## 2019-06-18 MED ORDER — CYCLOBENZAPRINE HCL 5 MG PO TABS: 5.0000 mg | ORAL_TABLET | Freq: Two times a day (BID) | ORAL | 0 refills | Status: DC | PRN

## 2019-06-18 MED ORDER — KETOROLAC TROMETHAMINE 60 MG/2ML IM SOLN
60.0000 mg | Freq: Once | INTRAMUSCULAR | Status: AC
  Administered 2019-06-18: 17:00:00 60 mg via INTRAMUSCULAR

## 2019-06-18 MED ORDER — DEXAMETHASONE SODIUM PHOSPHATE 10 MG/ML IJ SOLN
INTRAMUSCULAR | Status: AC
  Filled 2019-06-18: qty 1

## 2019-06-18 MED ORDER — KETOROLAC TROMETHAMINE 60 MG/2ML IM SOLN
INTRAMUSCULAR | Status: AC
  Filled 2019-06-18: qty 2

## 2019-06-18 NOTE — ED Triage Notes (Signed)
Pt states she has had a headache x 3 days. Pt states she has body aches and pt states her heart beats fast some times. X 3days

## 2019-06-18 NOTE — Discharge Instructions (Signed)
We gave you toradol and decadron today Continue with naprosyn twice daily with food tomorrow You may use flexeril as needed to help with pain. This is a muscle relaxer and causes sedation- please use only at bedtime or when you will be home and not have to drive/work Gentle stretching
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
## 2019-06-22 LAB — NOVEL CORONAVIRUS, NAA (HOSP ORDER, SEND-OUT TO REF LAB; TAT 18-24 HRS): SARS-CoV-2, NAA: NOT DETECTED

## 2019-07-19 ENCOUNTER — Emergency Department (HOSPITAL_COMMUNITY): Payer: BLUE CROSS/BLUE SHIELD

## 2019-07-19 ENCOUNTER — Emergency Department (HOSPITAL_COMMUNITY)
Admission: EM | Admit: 2019-07-19 | Discharge: 2019-07-19 | Disposition: A | Discharge status: Home / Self Care | Payer: BLUE CROSS/BLUE SHIELD | Attending: Emergency Medicine | Admitting: Emergency Medicine

## 2019-07-19 ENCOUNTER — Other Ambulatory Visit: Payer: Self-pay

## 2019-07-19 DIAGNOSIS — J189 Pneumonia, unspecified organism: Secondary | ICD-10-CM | POA: Insufficient documentation

## 2019-07-19 DIAGNOSIS — U071 COVID-19: Secondary | ICD-10-CM | POA: Diagnosis not present

## 2019-07-19 DIAGNOSIS — R059 Cough, unspecified: Secondary | ICD-10-CM

## 2019-07-19 DIAGNOSIS — G44319 Acute post-traumatic headache, not intractable: Secondary | ICD-10-CM | POA: Insufficient documentation

## 2019-07-19 DIAGNOSIS — R05 Cough: Secondary | ICD-10-CM | POA: Diagnosis present

## 2019-07-19 DIAGNOSIS — Z79899 Other long term (current) drug therapy: Secondary | ICD-10-CM | POA: Insufficient documentation

## 2019-07-19 DIAGNOSIS — Z791 Long term (current) use of non-steroidal anti-inflammatories (NSAID): Secondary | ICD-10-CM | POA: Diagnosis not present

## 2019-07-19 LAB — BASIC METABOLIC PANEL
Anion gap: 12 (ref 5–15)
BUN: 5 mg/dL — ABNORMAL LOW (ref 6–20)
CO2: 24 mmol/L (ref 22–32)
Calcium: 8.7 mg/dL — ABNORMAL LOW (ref 8.9–10.3)
Chloride: 100 mmol/L (ref 98–111)
Creatinine, Ser: 0.76 mg/dL (ref 0.44–1.00)
GFR calc Af Amer: >60 mL/min (ref >60)
GFR calc non Af Amer: >60 mL/min (ref >60)
Glucose, Bld: 110 mg/dL — ABNORMAL HIGH (ref 70–99)
Potassium: 3.2 mmol/L — ABNORMAL LOW (ref 3.5–5.1)
Sodium: 136 mmol/L (ref 135–145)

## 2019-07-19 LAB — URINALYSIS, ROUTINE W REFLEX MICROSCOPIC
Bacteria, UA: NONE SEEN
Bilirubin Urine: NEGATIVE
Glucose, UA: NEGATIVE mg/dL
Ketones, ur: NEGATIVE mg/dL
Leukocytes,Ua: NEGATIVE
Nitrite: NEGATIVE
Protein, ur: 30 mg/dL — AB
Specific Gravity, Urine: >1.046 — ABNORMAL HIGH (ref 1.005–1.030)
pH: 7 (ref 5.0–8.0)

## 2019-07-19 LAB — CBC WITH DIFFERENTIAL/PLATELET
Abs Immature Granulocytes: 0.01 10*3/uL (ref 0.00–0.07)
Basophils Absolute: 0 10*3/uL (ref 0.0–0.1)
Basophils Relative: 0 %
Eosinophils Absolute: 0 10*3/uL (ref 0.0–0.5)
Eosinophils Relative: 0 %
HCT: 36.9 % (ref 36.0–46.0)
Hemoglobin: 11.4 g/dL — ABNORMAL LOW (ref 12.0–15.0)
Immature Granulocytes: 0 %
Lymphocytes Relative: 38 %
Lymphs Abs: 1.1 10*3/uL (ref 0.7–4.0)
MCH: 27.3 pg (ref 26.0–34.0)
MCHC: 30.9 g/dL (ref 30.0–36.0)
MCV: 88.5 fL (ref 80.0–100.0)
Monocytes Absolute: 0.3 10*3/uL (ref 0.1–1.0)
Monocytes Relative: 12 %
Neutro Abs: 1.4 10*3/uL — ABNORMAL LOW (ref 1.7–7.7)
Neutrophils Relative %: 50 %
Platelets: 312 10*3/uL (ref 150–400)
RBC: 4.17 MIL/uL (ref 3.87–5.11)
RDW: 13.7 % (ref 11.5–15.5)
WBC: 2.9 10*3/uL — ABNORMAL LOW (ref 4.0–10.5)
nRBC: 0 % (ref 0.0–0.2)

## 2019-07-19 LAB — I-STAT BETA HCG BLOOD, ED (MC, WL, AP ONLY): I-stat hCG, quantitative: <5 m[IU]/mL (ref <5)

## 2019-07-19 MED ORDER — KETOROLAC TROMETHAMINE 30 MG/ML IJ SOLN
30.0000 mg | Freq: Once | INTRAMUSCULAR | Status: AC
  Administered 2019-07-19: 30 mg via INTRAVENOUS
  Filled 2019-07-19: qty 1

## 2019-07-19 MED ORDER — MORPHINE SULFATE (PF) 4 MG/ML IV SOLN
4.0000 mg | Freq: Once | INTRAVENOUS | Status: AC
  Administered 2019-07-19: 4 mg via INTRAVENOUS
  Filled 2019-07-19: qty 1

## 2019-07-19 MED ORDER — DIPHENHYDRAMINE HCL 50 MG/ML IJ SOLN
12.5000 mg | Freq: Once | INTRAMUSCULAR | Status: AC
  Administered 2019-07-19: 12.5 mg via INTRAVENOUS
  Filled 2019-07-19: qty 1

## 2019-07-19 MED ORDER — BENZONATATE 100 MG PO CAPS: 100.0000 mg | ORAL_CAPSULE | Freq: Three times a day (TID) | ORAL | 0 refills | Status: DC

## 2019-07-19 MED ORDER — SODIUM CHLORIDE 0.9 % IV BOLUS
1000.0000 mL | Freq: Once | INTRAVENOUS | Status: AC
  Administered 2019-07-19: 1000 mL via INTRAVENOUS

## 2019-07-19 MED ORDER — PROCHLORPERAZINE EDISYLATE 10 MG/2ML IJ SOLN
10.0000 mg | Freq: Once | INTRAMUSCULAR | Status: AC
  Administered 2019-07-19: 19:00:00 10 mg via INTRAVENOUS
  Filled 2019-07-19: qty 2

## 2019-07-19 MED ORDER — AZITHROMYCIN 250 MG PO TABS: 250.0000 mg | ORAL_TABLET | Freq: Every day | ORAL | 0 refills | Status: DC

## 2019-07-19 MED ORDER — IOHEXOL 350 MG/ML SOLN
75.0000 mL | Freq: Once | INTRAVENOUS | Status: AC | PRN
  Administered 2019-07-19: 75 mL via INTRAVENOUS
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
## 2019-07-19 NOTE — ED Notes (Signed)
Discharge instructions discussed with pt. No questions at this time.

## 2019-07-19 NOTE — ED Notes (Signed)
Patient transported to X-ray 

## 2019-07-19 NOTE — ED Notes (Signed)
Patient transported to CT 
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
## 2019-07-19 NOTE — ED Notes (Signed)
Pt notified we need a urine sample but unable to go at this time. 

## 2019-07-19 NOTE — ED Triage Notes (Signed)
Cough for two days.

## 2019-07-19 NOTE — Discharge Instructions (Addendum)
Take the antibiotics as prescribed for your pneumonia.  Your COVID test is pending.  You will be called with the results.  If you develop worsening fever, coughing up blood, shortness of breath please seek reevaluation.  Please follow-up with concussion clinic or contact information is listed in your discharge paperwork for your chronic headaches.

## 2019-07-21 LAB — NOVEL CORONAVIRUS, NAA (HOSP ORDER, SEND-OUT TO REF LAB; TAT 18-24 HRS): SARS-CoV-2, NAA: DETECTED — AB

## 2019-08-04 ENCOUNTER — Other Ambulatory Visit: Payer: Self-pay

## 2019-08-04 ENCOUNTER — Emergency Department (HOSPITAL_COMMUNITY)
Admission: EM | Admit: 2019-08-04 | Discharge: 2019-08-04 | Disposition: A | Discharge status: Home / Self Care | Payer: BLUE CROSS/BLUE SHIELD | Attending: Emergency Medicine | Admitting: Emergency Medicine

## 2019-08-04 DIAGNOSIS — Z7689 Persons encountering health services in other specified circumstances: Secondary | ICD-10-CM | POA: Diagnosis present

## 2019-08-04 DIAGNOSIS — Z8709 Personal history of other diseases of the respiratory system: Secondary | ICD-10-CM | POA: Insufficient documentation

## 2019-08-04 NOTE — ED Notes (Signed)
Patient verbalized understanding of discharge instructions and denies any further needs or questions at this time. VS stable. Patient ambulatory with steady gait.  

## 2019-08-04 NOTE — ED Triage Notes (Signed)
Pt was here 2 weeks ago and diagnosed with covid after having a cough and headache. Pt's sx resolved. Needs work note stating that she can go back to work. VSS.
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
## 2020-11-23 ENCOUNTER — Ambulatory Visit: Payer: BLUE CROSS/BLUE SHIELD | Admitting: Obstetrics & Gynecology

## 2020-11-23 ENCOUNTER — Other Ambulatory Visit: Payer: Self-pay

## 2021-01-21 ENCOUNTER — Other Ambulatory Visit: Payer: Self-pay | Admitting: Internal Medicine

## 2021-01-22 LAB — CBC
HCT: 28.8 % — ABNORMAL LOW (ref 35.0–45.0)
Hemoglobin: 9.4 g/dL — ABNORMAL LOW (ref 11.7–15.5)
MCH: 27.9 pg (ref 27.0–33.0)
MCHC: 32.6 g/dL (ref 32.0–36.0)
MCV: 85.5 fL (ref 80.0–100.0)
MPV: 10.8 fL (ref 7.5–12.5)
Platelets: 309 10*3/uL (ref 140–400)
RBC: 3.37 10*6/uL — ABNORMAL LOW (ref 3.80–5.10)
RDW: 13.2 % (ref 11.0–15.0)
WBC: 3.3 10*3/uL — ABNORMAL LOW (ref 3.8–10.8)

## 2021-01-22 LAB — VITAMIN D 25 HYDROXY (VIT D DEFICIENCY, FRACTURES): Vit D, 25-Hydroxy: 29 ng/mL — ABNORMAL LOW (ref 30–100)

## 2022-01-09 ENCOUNTER — Ambulatory Visit (HOSPITAL_COMMUNITY)
Admission: EM | Admit: 2022-01-09 | Discharge: 2022-01-09 | Disposition: A | Discharge status: Home / Self Care | Payer: Managed Care, Other (non HMO) | Attending: Sports Medicine | Admitting: Sports Medicine

## 2022-01-09 ENCOUNTER — Other Ambulatory Visit: Payer: Self-pay

## 2022-01-09 ENCOUNTER — Encounter (HOSPITAL_COMMUNITY): Payer: Self-pay | Admitting: Emergency Medicine

## 2022-01-09 DIAGNOSIS — M545 Low back pain, unspecified: Secondary | ICD-10-CM

## 2022-01-09 DIAGNOSIS — G43819 Other migraine, intractable, without status migrainosus: Secondary | ICD-10-CM

## 2022-01-09 MED ORDER — KETOROLAC TROMETHAMINE 30 MG/ML IJ SOLN
30.0000 mg | Freq: Once | INTRAMUSCULAR | Status: AC
  Administered 2022-01-09: 30 mg via INTRAMUSCULAR

## 2022-01-09 MED ORDER — KETOROLAC TROMETHAMINE 30 MG/ML IJ SOLN
INTRAMUSCULAR | Status: AC
Start: 2022-01-09 — End: ?
  Filled 2022-01-09: qty 1

## 2022-01-09 MED ORDER — METHYLPREDNISOLONE SODIUM SUCC 125 MG IJ SOLR
60.0000 mg | Freq: Once | INTRAMUSCULAR | Status: AC
  Administered 2022-01-09: 60 mg via INTRAMUSCULAR

## 2022-01-09 MED ORDER — METHYLPREDNISOLONE SODIUM SUCC 125 MG IJ SOLR
INTRAMUSCULAR | Status: AC
  Filled 2022-01-09: qty 2

## 2022-01-09 NOTE — Discharge Instructions (Signed)
Do not take any ibuprofen or Motrin today.  Starting tomorrow you may take this with food as needed  If you are still having pain, you may take Tylenol.  I know it is probably tough with her job, but recommend adequate rest, good sleep, and maintaining good hydration with water intake over the next few days  If not getting any better or your headache continues to get worse, would recommend reevaluation in the urgent care or ED.  Otherwise follow-up with your PCP
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
## 2022-01-09 NOTE — ED Triage Notes (Signed)
Pt reports lower back pain x 1 week and headache 4 days.  States when blowing nose, feels like ears are "clogged up"

## 2022-02-15 ENCOUNTER — Encounter (HOSPITAL_COMMUNITY): Payer: Self-pay | Admitting: Emergency Medicine

## 2022-02-15 ENCOUNTER — Other Ambulatory Visit: Payer: Self-pay

## 2022-02-15 ENCOUNTER — Ambulatory Visit (HOSPITAL_COMMUNITY)
Admission: EM | Admit: 2022-02-15 | Discharge: 2022-02-15 | Disposition: A | Discharge status: Home / Self Care | Payer: Managed Care, Other (non HMO) | Attending: Physician Assistant | Admitting: Physician Assistant

## 2022-02-15 DIAGNOSIS — R051 Acute cough: Secondary | ICD-10-CM | POA: Diagnosis present

## 2022-02-15 DIAGNOSIS — J069 Acute upper respiratory infection, unspecified: Secondary | ICD-10-CM | POA: Diagnosis present

## 2022-02-15 DIAGNOSIS — R52 Pain, unspecified: Secondary | ICD-10-CM | POA: Insufficient documentation

## 2022-02-15 DIAGNOSIS — Z20822 Contact with and (suspected) exposure to covid-19: Secondary | ICD-10-CM | POA: Diagnosis not present

## 2022-02-15 LAB — POC INFLUENZA A AND B ANTIGEN (URGENT CARE ONLY)
INFLUENZA A ANTIGEN, POC: NEGATIVE
INFLUENZA B ANTIGEN, POC: NEGATIVE

## 2022-02-15 MED ORDER — NAPROXEN 500 MG PO TABS: 500.0000 mg | ORAL_TABLET | Freq: Two times a day (BID) | ORAL | 0 refills | Status: DC

## 2022-02-15 MED ORDER — PROMETHAZINE-DM 6.25-15 MG/5ML PO SYRP: 5.0000 mL | ORAL_SOLUTION | Freq: Two times a day (BID) | ORAL | 0 refills | Status: DC | PRN

## 2022-02-15 NOTE — ED Triage Notes (Signed)
Pt is present today with HA, cough, back lower, and body aches,. Pt sx started yesterday  ?
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
## 2022-02-15 NOTE — Discharge Instructions (Signed)
You are negative for flu.  We will contact you if your COVID test is positive.  Please take Naprosyn twice daily for pain and headache.  Do not take NSAIDs including aspirin, ibuprofen/Advil, naproxen/Aleve with this medication as it can cause stomach bleeding.  You can use Tylenol, Mucinex, Flonase for symptom relief.  She rest and drink plenty of fluid.  Take Promethazine DM for cough.  This will make you sleepy so do not drive or drink alcohol while taking it.  If anything worsens please return for reevaluation.  If symptoms do not improving return for reevaluation. ?

## 2022-02-16 LAB — SARS CORONAVIRUS 2 (TAT 6-24 HRS): SARS Coronavirus 2: NEGATIVE

## 2023-02-26 DIAGNOSIS — K5909 Other constipation: Secondary | ICD-10-CM | POA: Diagnosis not present

## 2023-02-26 DIAGNOSIS — E669 Obesity, unspecified: Secondary | ICD-10-CM | POA: Diagnosis not present

## 2023-02-26 DIAGNOSIS — Z0000 Encounter for general adult medical examination without abnormal findings: Secondary | ICD-10-CM | POA: Diagnosis not present

## 2023-02-26 DIAGNOSIS — Z131 Encounter for screening for diabetes mellitus: Secondary | ICD-10-CM | POA: Diagnosis not present

## 2023-02-26 DIAGNOSIS — M545 Low back pain, unspecified: Secondary | ICD-10-CM | POA: Diagnosis not present

## 2023-02-26 DIAGNOSIS — G43011 Migraine without aura, intractable, with status migrainosus: Secondary | ICD-10-CM | POA: Diagnosis not present

## 2023-02-26 DIAGNOSIS — D649 Anemia, unspecified: Secondary | ICD-10-CM | POA: Diagnosis not present

## 2023-03-27 DIAGNOSIS — G43011 Migraine without aura, intractable, with status migrainosus: Secondary | ICD-10-CM | POA: Diagnosis not present

## 2023-03-27 DIAGNOSIS — M545 Low back pain, unspecified: Secondary | ICD-10-CM | POA: Diagnosis not present

## 2023-03-27 DIAGNOSIS — E669 Obesity, unspecified: Secondary | ICD-10-CM | POA: Diagnosis not present

## 2023-03-27 DIAGNOSIS — K5909 Other constipation: Secondary | ICD-10-CM | POA: Diagnosis not present

## 2023-03-27 DIAGNOSIS — D649 Anemia, unspecified: Secondary | ICD-10-CM | POA: Diagnosis not present

## 2023-05-29 DIAGNOSIS — D649 Anemia, unspecified: Secondary | ICD-10-CM | POA: Diagnosis not present

## 2023-05-29 DIAGNOSIS — M545 Low back pain, unspecified: Secondary | ICD-10-CM | POA: Diagnosis not present

## 2023-05-29 DIAGNOSIS — K5909 Other constipation: Secondary | ICD-10-CM | POA: Diagnosis not present

## 2023-05-29 DIAGNOSIS — G43011 Migraine without aura, intractable, with status migrainosus: Secondary | ICD-10-CM | POA: Diagnosis not present

## 2023-05-29 DIAGNOSIS — E669 Obesity, unspecified: Secondary | ICD-10-CM | POA: Diagnosis not present

## 2023-08-30 DIAGNOSIS — M5441 Lumbago with sciatica, right side: Secondary | ICD-10-CM | POA: Diagnosis not present

## 2023-08-30 DIAGNOSIS — M5442 Lumbago with sciatica, left side: Secondary | ICD-10-CM | POA: Diagnosis not present

## 2023-08-30 DIAGNOSIS — G8929 Other chronic pain: Secondary | ICD-10-CM | POA: Diagnosis not present

## 2023-08-30 DIAGNOSIS — M545 Low back pain, unspecified: Secondary | ICD-10-CM | POA: Diagnosis not present

## 2023-09-13 DIAGNOSIS — M545 Low back pain, unspecified: Secondary | ICD-10-CM | POA: Diagnosis not present

## 2023-09-27 DIAGNOSIS — G8929 Other chronic pain: Secondary | ICD-10-CM | POA: Diagnosis not present

## 2023-09-27 DIAGNOSIS — M5442 Lumbago with sciatica, left side: Secondary | ICD-10-CM | POA: Diagnosis not present

## 2023-09-27 DIAGNOSIS — M5441 Lumbago with sciatica, right side: Secondary | ICD-10-CM | POA: Diagnosis not present

## 2023-10-03 ENCOUNTER — Encounter: Payer: Self-pay | Admitting: Physical Therapy

## 2023-10-03 ENCOUNTER — Ambulatory Visit: Payer: 59 | Admitting: Physical Therapy
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
## 2023-11-01 ENCOUNTER — Ambulatory Visit: Payer: 59
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
## 2023-11-02 ENCOUNTER — Encounter: Payer: Self-pay | Admitting: Physical Therapy

## 2023-11-02 ENCOUNTER — Ambulatory Visit: Payer: 59 | Attending: Sports Medicine | Admitting: Physical Therapy

## 2023-11-02 DIAGNOSIS — M5459 Other low back pain: Secondary | ICD-10-CM | POA: Diagnosis not present

## 2023-11-15 ENCOUNTER — Ambulatory Visit: Payer: 59

## 2023-11-15 DIAGNOSIS — M5459 Other low back pain: Secondary | ICD-10-CM
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
## 2023-11-22 ENCOUNTER — Encounter: Payer: 59 | Admitting: Physical Therapy

## 2023-11-26 ENCOUNTER — Ambulatory Visit: Payer: 59 | Attending: Sports Medicine | Admitting: Physical Therapy

## 2023-11-26 ENCOUNTER — Other Ambulatory Visit: Payer: Self-pay

## 2023-11-26 ENCOUNTER — Encounter: Payer: Self-pay | Admitting: Physical Therapy

## 2023-11-26 DIAGNOSIS — M5459 Other low back pain: Secondary | ICD-10-CM | POA: Diagnosis not present
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
## 2023-11-29 ENCOUNTER — Encounter: Payer: 59 | Admitting: Physical Therapy

## 2023-12-03 ENCOUNTER — Ambulatory Visit: Payer: 59 | Admitting: Physical Therapy

## 2023-12-03 ENCOUNTER — Other Ambulatory Visit: Payer: Self-pay

## 2023-12-03 ENCOUNTER — Encounter: Payer: Self-pay | Admitting: Physical Therapy

## 2023-12-03 DIAGNOSIS — M5459 Other low back pain: Secondary | ICD-10-CM
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
## 2023-12-06 ENCOUNTER — Ambulatory Visit: Payer: 59

## 2023-12-06 DIAGNOSIS — M5459 Other low back pain: Secondary | ICD-10-CM
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
## 2023-12-10 ENCOUNTER — Ambulatory Visit: Payer: 59

## 2023-12-10 DIAGNOSIS — M5459 Other low back pain: Secondary | ICD-10-CM | POA: Diagnosis not present
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
## 2023-12-12 ENCOUNTER — Ambulatory Visit: Payer: 59

## 2023-12-12 DIAGNOSIS — M5459 Other low back pain: Secondary | ICD-10-CM | POA: Diagnosis not present
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
## 2023-12-18 ENCOUNTER — Ambulatory Visit: Payer: 59 | Admitting: Physical Therapy

## 2023-12-18 ENCOUNTER — Other Ambulatory Visit: Payer: Self-pay

## 2023-12-18 ENCOUNTER — Encounter: Payer: Self-pay | Admitting: Physical Therapy

## 2023-12-18 DIAGNOSIS — M5459 Other low back pain: Secondary | ICD-10-CM
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
## 2023-12-20 ENCOUNTER — Ambulatory Visit: Payer: 59 | Admitting: Physical Therapy

## 2023-12-20 ENCOUNTER — Encounter: Payer: Self-pay | Admitting: Physical Therapy

## 2023-12-20 DIAGNOSIS — M5459 Other low back pain: Secondary | ICD-10-CM
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
## 2023-12-28 DIAGNOSIS — J302 Other seasonal allergic rhinitis: Secondary | ICD-10-CM | POA: Diagnosis not present

## 2023-12-28 DIAGNOSIS — E669 Obesity, unspecified: Secondary | ICD-10-CM | POA: Diagnosis not present

## 2023-12-28 DIAGNOSIS — D649 Anemia, unspecified: Secondary | ICD-10-CM | POA: Diagnosis not present

## 2023-12-28 DIAGNOSIS — G43011 Migraine without aura, intractable, with status migrainosus: Secondary | ICD-10-CM | POA: Diagnosis not present

## 2023-12-28 DIAGNOSIS — G8929 Other chronic pain: Secondary | ICD-10-CM | POA: Diagnosis not present

## 2024-01-04 ENCOUNTER — Other Ambulatory Visit: Payer: Self-pay

## 2024-01-04 ENCOUNTER — Encounter: Payer: Self-pay | Admitting: Physical Therapy

## 2024-01-04 ENCOUNTER — Ambulatory Visit: Payer: 59 | Attending: Sports Medicine | Admitting: Physical Therapy

## 2024-01-04 DIAGNOSIS — M5459 Other low back pain: Secondary | ICD-10-CM | POA: Diagnosis not present

## 2024-01-04 NOTE — Patient Instructions (Signed)
Access Code: [email or web address] URL: [email or web address] Date: 01/04/2024 Prepared by: Rosana Hoes  Exercises - Straight Leg Raise  - 1 x daily - 2 sets - 10 reps - Bridge  - 1 x daily - 2 sets - 10 reps - Clam  - 1 x daily - 2 sets - 20 reps - Supine 90/90 Abdominal Bracing  - 1 x daily - 2 sets - 10 reps
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
## 2024-01-07 ENCOUNTER — Ambulatory Visit: Payer: 59 | Admitting: Physical Therapy

## 2024-01-07 ENCOUNTER — Encounter: Payer: Self-pay | Admitting: Physical Therapy

## 2024-01-07 DIAGNOSIS — M5459 Other low back pain: Secondary | ICD-10-CM

## 2024-01-07 NOTE — Addendum Note (Signed)
Addended by: Royden Purl on: 01/07/2024 11:56 AM   Modules accepted: Orders
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
## 2024-01-16 ENCOUNTER — Encounter: Payer: Self-pay | Admitting: Physical Therapy

## 2024-01-16 ENCOUNTER — Ambulatory Visit: Payer: 59 | Admitting: Physical Therapy

## 2024-01-16 DIAGNOSIS — M5459 Other low back pain: Secondary | ICD-10-CM | POA: Diagnosis not present
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
## 2024-01-18 DIAGNOSIS — D509 Iron deficiency anemia, unspecified: Secondary | ICD-10-CM | POA: Diagnosis not present

## 2024-01-18 DIAGNOSIS — G8929 Other chronic pain: Secondary | ICD-10-CM | POA: Diagnosis not present

## 2024-01-18 DIAGNOSIS — J302 Other seasonal allergic rhinitis: Secondary | ICD-10-CM | POA: Diagnosis not present

## 2024-01-18 DIAGNOSIS — D649 Anemia, unspecified: Secondary | ICD-10-CM | POA: Diagnosis not present

## 2024-01-18 DIAGNOSIS — E669 Obesity, unspecified: Secondary | ICD-10-CM | POA: Diagnosis not present

## 2024-01-18 DIAGNOSIS — G43011 Migraine without aura, intractable, with status migrainosus: Secondary | ICD-10-CM | POA: Diagnosis not present

## 2024-01-18 DIAGNOSIS — D72818 Other decreased white blood cell count: Secondary | ICD-10-CM | POA: Diagnosis not present

## 2024-01-21 ENCOUNTER — Ambulatory Visit: Payer: 59 | Attending: Sports Medicine | Admitting: Physical Therapy

## 2024-01-21 ENCOUNTER — Encounter: Payer: Self-pay | Admitting: Physical Therapy

## 2024-01-21 ENCOUNTER — Other Ambulatory Visit: Payer: Self-pay

## 2024-01-21 DIAGNOSIS — M5459 Other low back pain: Secondary | ICD-10-CM | POA: Insufficient documentation
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
## 2024-01-28 ENCOUNTER — Other Ambulatory Visit: Payer: Self-pay

## 2024-01-28 ENCOUNTER — Encounter: Payer: Self-pay | Admitting: Physical Therapy

## 2024-01-28 ENCOUNTER — Ambulatory Visit: Payer: 59 | Admitting: Physical Therapy

## 2024-01-28 DIAGNOSIS — M5459 Other low back pain: Secondary | ICD-10-CM
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
## 2024-04-18 ENCOUNTER — Encounter: Payer: Self-pay | Admitting: Neurology

## 2024-04-18 DIAGNOSIS — G8929 Other chronic pain: Secondary | ICD-10-CM | POA: Diagnosis not present

## 2024-04-18 DIAGNOSIS — G43011 Migraine without aura, intractable, with status migrainosus: Secondary | ICD-10-CM | POA: Diagnosis not present

## 2024-04-18 DIAGNOSIS — D72818 Other decreased white blood cell count: Secondary | ICD-10-CM | POA: Diagnosis not present

## 2024-04-18 DIAGNOSIS — D649 Anemia, unspecified: Secondary | ICD-10-CM | POA: Diagnosis not present

## 2024-04-18 DIAGNOSIS — D509 Iron deficiency anemia, unspecified: Secondary | ICD-10-CM | POA: Diagnosis not present

## 2024-04-18 DIAGNOSIS — E669 Obesity, unspecified: Secondary | ICD-10-CM | POA: Diagnosis not present

## 2024-04-18 DIAGNOSIS — J302 Other seasonal allergic rhinitis: Secondary | ICD-10-CM | POA: Diagnosis not present

## 2024-04-21 ENCOUNTER — Other Ambulatory Visit: Payer: Self-pay | Admitting: Physician Assistant

## 2024-04-21 DIAGNOSIS — Z0000 Encounter for general adult medical examination without abnormal findings: Secondary | ICD-10-CM

## 2024-05-01 DIAGNOSIS — E669 Obesity, unspecified: Secondary | ICD-10-CM | POA: Diagnosis not present

## 2024-05-01 DIAGNOSIS — D509 Iron deficiency anemia, unspecified: Secondary | ICD-10-CM | POA: Diagnosis not present

## 2024-05-01 DIAGNOSIS — G43011 Migraine without aura, intractable, with status migrainosus: Secondary | ICD-10-CM | POA: Diagnosis not present

## 2024-05-01 DIAGNOSIS — D649 Anemia, unspecified: Secondary | ICD-10-CM | POA: Diagnosis not present

## 2024-05-01 DIAGNOSIS — Z131 Encounter for screening for diabetes mellitus: Secondary | ICD-10-CM | POA: Diagnosis not present

## 2024-05-01 DIAGNOSIS — J302 Other seasonal allergic rhinitis: Secondary | ICD-10-CM | POA: Diagnosis not present

## 2024-05-01 DIAGNOSIS — G8929 Other chronic pain: Secondary | ICD-10-CM | POA: Diagnosis not present

## 2024-05-01 DIAGNOSIS — Z0000 Encounter for general adult medical examination without abnormal findings: Secondary | ICD-10-CM | POA: Diagnosis not present

## 2024-05-02 ENCOUNTER — Ambulatory Visit
Admission: RE | Admit: 2024-05-02 | Discharge: 2024-05-02 | Disposition: A | Discharge status: Home / Self Care | Source: Ambulatory Visit | Attending: Physician Assistant | Admitting: Physician Assistant

## 2024-05-02 DIAGNOSIS — Z0000 Encounter for general adult medical examination without abnormal findings: Secondary | ICD-10-CM

## 2024-05-02 DIAGNOSIS — Z1231 Encounter for screening mammogram for malignant neoplasm of breast: Secondary | ICD-10-CM | POA: Diagnosis not present

## 2024-05-08 ENCOUNTER — Inpatient Hospital Stay

## 2024-05-08 ENCOUNTER — Encounter: Payer: Self-pay | Admitting: Hematology and Oncology

## 2024-05-08 ENCOUNTER — Other Ambulatory Visit: Payer: Self-pay | Admitting: Hematology and Oncology

## 2024-05-08 ENCOUNTER — Other Ambulatory Visit: Payer: Self-pay | Admitting: Physician Assistant

## 2024-05-08 ENCOUNTER — Inpatient Hospital Stay: Attending: Hematology and Oncology | Admitting: Hematology and Oncology

## 2024-05-08 ENCOUNTER — Telehealth: Payer: Self-pay

## 2024-05-08 VITALS — BP 118/66 | HR 74 | Temp 98.5°F | Resp 18 | Ht 62.0 in | Wt 230.0 lb

## 2024-05-08 DIAGNOSIS — N920 Excessive and frequent menstruation with regular cycle: Secondary | ICD-10-CM | POA: Insufficient documentation

## 2024-05-08 DIAGNOSIS — D500 Iron deficiency anemia secondary to blood loss (chronic): Secondary | ICD-10-CM | POA: Diagnosis not present

## 2024-05-08 DIAGNOSIS — Z5941 Food insecurity: Secondary | ICD-10-CM | POA: Diagnosis not present

## 2024-05-08 DIAGNOSIS — R928 Other abnormal and inconclusive findings on diagnostic imaging of breast: Secondary | ICD-10-CM

## 2024-05-08 NOTE — Telephone Encounter (Signed)
 Hello,  Patient will be scheduled as soon as possible.  Auth Submission: NO AUTH NEEDED Site of care: [facility] Payer: [employer] Medication & CPT/J Code(s) submitted: Venofer (Iron Sucrose) J1756 Diagnosis Code: D50.0 Route of submission (phone, fax, portal): PORTAL Phone # Fax # Auth type: Buy/Bill PB Units/visits requested: 300MG  X 3DOSES Reference number:  Approval from: 05/08/24 to 11/19/24

## 2024-05-08 NOTE — Assessment & Plan Note (Signed)
 The most likely cause of her anemia is due to chronic blood loss/malabsorption syndrome. We discussed some of the risks, benefits, and alternatives of intravenous iron infusions. The patient is symptomatic from anemia and the iron level is critically low. She tolerated oral iron supplement poorly and desires to achieved higher levels of iron faster for adequate hematopoesis. Some of the side-effects to be expected including risks of infusion reactions, phlebitis, headaches, nausea and fatigue.  The patient is willing to proceed. Patient education material was dispensed.  Goal is to keep ferritin level greater than 50 and resolution of anemia I recommend 3 doses of intravenous iron sucrose at 300 mg dose I plan to see her again in 3 months for further follow-up

## 2024-05-08 NOTE — Progress Notes (Signed)
 [facility] CONSULT NOTE  Patient Care Team: Patient, No Pcp Per as PCP - General (General Practice)  ASSESSMENT & PLAN:  Iron deficiency anemia due to chronic blood loss The most likely cause of her anemia is due to chronic blood loss/malabsorption syndrome. We discussed some of the risks, benefits, and alternatives of intravenous iron infusions. The patient is symptomatic from anemia and the iron level is critically low. She tolerated oral iron supplement poorly and desires to achieved higher levels of iron faster for adequate hematopoesis. Some of the side-effects to be expected including risks of infusion reactions, phlebitis, headaches, nausea and fatigue.  The patient is willing to proceed. Patient education material was dispensed.  Goal is to keep ferritin level greater than 50 and resolution of anemia I recommend 3 doses of intravenous iron sucrose at 300 mg dose I plan to see her again in 3 months for further follow-up   Menorrhagia with regular cycle I recommend the patient to discuss treatment of menorrhagia with her primary care doctor or gynecologist Orders Placed This Encounter  Procedures   Ferritin    Standing Status:   Future    Expiration Date:   05/08/2025   Iron and Iron Binding Capacity ([facility] only)    Standing Status:   Future    Expiration Date:   05/08/2025   CBC with Differential (Cancer Center Only)    Standing Status:   Future    Expiration Date:   05/08/2025   Vitamin B12    Standing Status:   Future    Expiration Date:   05/08/2025   TSH    Standing Status:   Future    Expiration Date:   05/08/2025   Reticulocytes    Standing Status:   Future    Expiration Date:   05/08/2025   Sedimentation rate    Standing Status:   Future    Expiration Date:   05/08/2025    All questions were answered. The patient knows to call the clinic with any problems, questions or concerns.  The total time spent in the appointment was 60 minutes encounter with  patients including review of chart and various tests results, discussions about plan of care and coordination of care plan  Almeda Jacobs, MD 6/19/20251:34 PM   CHIEF COMPLAINTS/PURPOSE OF CONSULTATION:  Anemia  HISTORY OF PRESENTING ILLNESS:  Crystal Figueroa 38 y.o. female is here because of anemia  She was found to have abnormal CBC from recent blood work I have the opportunity to review his CBC electronically dated back to April 2009 Between 2009 to 2025, she has chronic anemia She have significant blood loss related to her childbirth in 2013 requiring blood transfusion support Most recently, she had repeat labs done with her primary care doctor On December 28, 2023, white count 3.7, hemoglobin 10.2, platelet count 338 Iron studies show ferritin of 35 with 18% saturation She complained of fatigue and occasional dizziness.  She has very frequent migraines She denies recent chest pain on exertion, shortness of breath on minimal exertion, pre-syncopal episodes, or palpitations. She had not noticed any recent bleeding such as epistaxis, hematuria or hematochezia The patient denies over the counter NSAID ingestion. She is not on antiplatelets agents.  She had no prior history or diagnosis of cancer. Her age appropriate screening programs are up-to-date. She denies any pica and eats a variety of diet. She never donated blood Since 2013, she has excessive heavy menstruation especially over the last few years Her  IUD was removed several years ago Her menorrhagia lasts about 7 days with a regular cycle but passage of large amount of clots She has been taking oral iron supplement daily for the past few months but is causing some GI distress  MEDICAL HISTORY:  Past Medical History:  Diagnosis Date   Headache(784.0)     SURGICAL HISTORY: Past Surgical History:  Procedure Laterality Date   CESAREAN SECTION  03/06/2012   Procedure: CESAREAN SECTION;  Surgeon: Granville Layer, MD;  Location: [facility];  Service: Gynecology;  Laterality: N/A;   NO PAST SURGERIES      SOCIAL HISTORY: Social History   Socioeconomic History   Marital status: Single    Spouse name: Not on file   Number of children: Not on file   Years of education: Not on file   Highest education level: Not on file  Occupational History   Not on file  Tobacco Use   Smoking status: Never   Smokeless tobacco: Never  Substance and Sexual Activity   Alcohol use: No   Drug use: No   Sexual activity: Yes    Birth control/protection: None  Other Topics Concern   Not on file  Social History Narrative   Not on file   Social Drivers of Health   Financial Resource Strain: Not on file  Food Insecurity: Food Insecurity Present (05/08/2024)   Hunger Vital Sign    Worried About Running Out of Food in the Last Year: Sometimes true    Ran Out of Food in the Last Year: Sometimes true  Transportation Needs: No Transportation Needs (05/08/2024)   PRAPARE - Administrator, Civil Service (Medical): No    Lack of Transportation (Non-Medical): No  Physical Activity: Not on file  Stress: Not on file  Social Connections: Unknown (04/04/2022)   [facility]   Social Network    Social Network: Not on file  Intimate Partner Violence: Not At Risk (05/08/2024)   Humiliation, Afraid, Rape, and Kick questionnaire    Fear of Current or Ex-Partner: No    Emotionally Abused: No    Physically Abused: No    Sexually Abused: No    FAMILY HISTORY: Family History  Problem Relation Age of Onset   Healthy Mother    Healthy Father    Anesthesia problems Neg Hx     ALLERGIES:  has no known allergies.  MEDICATIONS:  Current Outpatient Medications  Medication Sig Dispense Refill   Daily Multiple Vitamins tablet Take 1 tablet by mouth daily.     ferrous sulfate 325 (65 FE) MG EC tablet Take 325 mg by mouth daily.     meloxicam (MOBIC) 7.5 MG tablet Take 7.5 mg by mouth daily as needed for pain.      topiramate (TOPAMAX) 25 MG tablet Take 25 mg by mouth 2 (two) times daily.     naproxen  (NAPROSYN ) 500 MG tablet Take 1 tablet (500 mg total) by mouth 2 (two) times daily. 30 tablet 0   promethazine -dextromethorphan (PROMETHAZINE -DM) 6.25-15 MG/5ML syrup Take 5 mLs by mouth 2 (two) times daily as needed for cough. 118 mL 0   sulindac (CLINORIL) 200 MG tablet Take 200 mg by mouth 2 (two) times daily.     No current facility-administered medications for this visit.    REVIEW OF SYSTEMS:   Constitutional: Denies fevers, chills or abnormal night sweats Eyes: Denies blurriness of vision, double vision or watery eyes Ears, nose, mouth, throat, and face: Denies  mucositis or sore throat Respiratory: Denies cough, dyspnea or wheezes Cardiovascular: Denies palpitation, chest discomfort or lower extremity swelling Gastrointestinal:  Denies nausea, heartburn or change in bowel habits Skin: Denies abnormal skin rashes Lymphatics: Denies new lymphadenopathy or easy bruising Neurological:Denies numbness, tingling or new weaknesses Behavioral/Psych: Mood is stable, no new changes  All other systems were reviewed with the patient and are negative.  PHYSICAL EXAMINATION: ECOG PERFORMANCE STATUS: 1 - Symptomatic but completely ambulatory  Vitals:   05/08/24 1206  BP: 118/66  Pulse: 74  Resp: 18  Temp: 98.5 F (36.9 C)  SpO2: 100%   Filed Weights   05/08/24 1206  Weight: 230 lb (104.3 kg)    GENERAL:alert, no distress and comfortable SKIN: skin color, texture, turgor are normal, no rashes or significant lesions EYES: normal, conjunctiva are pink and non-injected, sclera clear OROPHARYNX:no exudate, no erythema and lips, buccal mucosa, and tongue normal  NECK: supple, thyroid normal size, non-tender, without nodularity LYMPH:  no palpable lymphadenopathy in the cervical, axillary or inguinal LUNGS: clear to auscultation and percussion with normal breathing effort HEART: regular rate & rhythm  and no murmurs and no lower extremity edema ABDOMEN:abdomen soft, non-tender and normal bowel sounds Musculoskeletal:no cyanosis of digits and no clubbing  PSYCH: alert & oriented x 3 with fluent speech NEURO: no focal motor/sensory deficits

## 2024-05-08 NOTE — Assessment & Plan Note (Signed)
 I recommend the patient to discuss treatment of menorrhagia with her primary care doctor or gynecologist

## 2024-05-08 NOTE — Telephone Encounter (Signed)
 Hi,  I ordered 300 mg x 3 doses

## 2024-05-09 NOTE — Telephone Encounter (Signed)
 Hi, my apologies...this has been updated. Thank you

## 2024-05-15 ENCOUNTER — Other Ambulatory Visit: Payer: Self-pay | Admitting: Physician Assistant

## 2024-05-15 ENCOUNTER — Ambulatory Visit
Admission: RE | Admit: 2024-05-15 | Discharge: 2024-05-15 | Disposition: A | Discharge status: Home / Self Care | Source: Ambulatory Visit | Attending: Physician Assistant | Admitting: Physician Assistant

## 2024-05-15 ENCOUNTER — Encounter: Payer: Self-pay | Admitting: Hematology and Oncology

## 2024-05-15 DIAGNOSIS — R928 Other abnormal and inconclusive findings on diagnostic imaging of breast: Secondary | ICD-10-CM

## 2024-05-15 DIAGNOSIS — N6315 Unspecified lump in the right breast, overlapping quadrants: Secondary | ICD-10-CM | POA: Diagnosis not present

## 2024-05-15 DIAGNOSIS — N6310 Unspecified lump in the right breast, unspecified quadrant: Secondary | ICD-10-CM

## 2024-06-02 ENCOUNTER — Ambulatory Visit

## 2024-06-02 VITALS — BP 107/74 | HR 62 | Temp 98.0°F | Resp 18 | Ht 62.0 in | Wt 228.6 lb

## 2024-06-02 DIAGNOSIS — N920 Excessive and frequent menstruation with regular cycle: Secondary | ICD-10-CM | POA: Diagnosis not present

## 2024-06-02 DIAGNOSIS — D500 Iron deficiency anemia secondary to blood loss (chronic): Secondary | ICD-10-CM

## 2024-06-02 DIAGNOSIS — D509 Iron deficiency anemia, unspecified: Secondary | ICD-10-CM | POA: Diagnosis not present

## 2024-06-02 MED ORDER — ACETAMINOPHEN 325 MG PO TABS
650.0000 mg | ORAL_TABLET | Freq: Once | ORAL | Status: AC
  Administered 2024-06-02: 650 mg via ORAL
  Filled 2024-06-02: qty 2

## 2024-06-02 MED ORDER — SODIUM CHLORIDE 0.9 % IV SOLN
300.0000 mg | Freq: Once | INTRAVENOUS | Status: AC
  Administered 2024-06-02: 300 mg via INTRAVENOUS
  Filled 2024-06-02: qty 15

## 2024-06-02 MED ORDER — DIPHENHYDRAMINE HCL 25 MG PO CAPS
25.0000 mg | ORAL_CAPSULE | Freq: Once | ORAL | Status: AC
  Administered 2024-06-02: 25 mg via ORAL
  Filled 2024-06-02: qty 1

## 2024-06-02 NOTE — Progress Notes (Signed)
 Diagnosis: Iron  Deficiency Anemia  Provider:  Praveen Mannam MD  Procedure: IV Infusion  IV Type: Peripheral, IV Location: R Antecubital  Venofer  (Iron  Sucrose), Dose: 300 mg  Infusion Start Time: 1044  Infusion Stop Time: 1228  Post Infusion IV Care: Observation period completed and Peripheral IV Discontinued  Discharge: Condition: Good, Destination: Home . AVS Provided  Performed by:  Leita FORBES Miles, LPN

## 2024-06-09 ENCOUNTER — Ambulatory Visit

## 2024-06-09 VITALS — BP 107/72 | HR 58 | Temp 98.0°F | Resp 16 | Ht 62.0 in | Wt 231.2 lb

## 2024-06-09 DIAGNOSIS — D500 Iron deficiency anemia secondary to blood loss (chronic): Secondary | ICD-10-CM

## 2024-06-09 DIAGNOSIS — D509 Iron deficiency anemia, unspecified: Secondary | ICD-10-CM

## 2024-06-09 DIAGNOSIS — N920 Excessive and frequent menstruation with regular cycle: Secondary | ICD-10-CM

## 2024-06-09 MED ORDER — SODIUM CHLORIDE 0.9 % IV SOLN
300.0000 mg | Freq: Once | INTRAVENOUS | Status: AC
  Administered 2024-06-09: 300 mg via INTRAVENOUS
  Filled 2024-06-09: qty 15

## 2024-06-09 MED ORDER — DIPHENHYDRAMINE HCL 25 MG PO CAPS
25.0000 mg | ORAL_CAPSULE | Freq: Once | ORAL | Status: AC
  Administered 2024-06-09: 25 mg via ORAL
  Filled 2024-06-09: qty 1

## 2024-06-09 MED ORDER — ACETAMINOPHEN 325 MG PO TABS
650.0000 mg | ORAL_TABLET | Freq: Once | ORAL | Status: AC
  Administered 2024-06-09: 650 mg via ORAL
  Filled 2024-06-09: qty 2

## 2024-06-09 NOTE — Progress Notes (Signed)
 Diagnosis: Iron  Deficiency Anemia  Provider:  Praveen Mannam MD  Procedure: IV Infusion  IV Type: Peripheral, IV Location: L Antecubital  Venofer  (Iron  Sucrose), Dose: 300 mg  Infusion Start Time: 1038  Infusion Stop Time: 1218  Post Infusion IV Care: Observation period completed and Peripheral IV Discontinued  Discharge: Condition: Good, Destination: Home . AVS Declined  Performed by:  Maximiano JONELLE Pouch, LPN
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
## 2024-06-12 ENCOUNTER — Encounter: Payer: Self-pay | Admitting: Neurology

## 2024-06-12 ENCOUNTER — Ambulatory Visit: Admitting: Neurology

## 2024-06-12 VITALS — BP 110/67 | HR 78 | Ht 62.0 in | Wt 230.0 lb

## 2024-06-12 DIAGNOSIS — G43709 Chronic migraine without aura, not intractable, without status migrainosus: Secondary | ICD-10-CM

## 2024-06-12 MED ORDER — AIMOVIG 140 MG/ML ~~LOC~~ SOAJ: 140.0000 mg | SUBCUTANEOUS | 11 refills | Status: DC

## 2024-06-12 MED ORDER — RIZATRIPTAN BENZOATE 10 MG PO TBDP: 10.0000 mg | ORAL_TABLET | ORAL | 11 refills | Status: DC | PRN

## 2024-06-12 MED ORDER — ONDANSETRON 4 MG PO TBDP: 4.0000 mg | ORAL_TABLET | Freq: Three times a day (TID) | ORAL | 5 refills | Status: DC | PRN

## 2024-06-12 NOTE — Patient Instructions (Addendum)
  Stop topiramate.  Start Aimovig  injection every 28 days.  Contact us  in 3 months with update Take rizatriptan  at earliest onset of headache.  May repeat dose once in 2 hours if needed.  Maximum 2 tablets in 24 hours. Take ondansetron  for nausea Limit use of pain relievers to no more than 9 days out of the month.  These medications include acetaminophen , NSAIDs (ibuprofen /Advil /Motrin , naproxen /Aleve , triptans (Imitrex/sumatriptan), Excedrin, and narcotics.  This will help reduce risk of rebound headaches. Be aware of common food triggers:  - Caffeine :  coffee, black tea, cola, Mt. Dew  - Chocolate  - Dairy:  aged cheeses (brie, blue, cheddar, gouda, Parmasan, provolone, romano, Swiss, etc), chocolate milk, buttermilk, sour cream, limit eggs and yogurt  - Nuts, peanut butter  - Alcohol  - Cereals/grains:  FRESH breads (fresh bagels, sourdough, doughnuts), yeast productions  - Processed/canned/aged/cured meats (pre-packaged deli meats, hotdogs)  - MSG/glutamate:  soy sauce, flavor enhancer, pickled/preserved/marinated foods  - Sweeteners:  aspartame (Equal, Nutrasweet).  Sugar and Splenda are okay  - Vegetables:  legumes (lima beans, lentils, snow peas, fava beans, pinto peans, peas, garbanzo beans), sauerkraut, onions, olives, pickles  - Fruit:  avocados, bananas, citrus fruit (orange, lemon, grapefruit), mango  - Other:  Frozen meals, macaroni and cheese Routine exercise Stay adequately hydrated (aim for 64 oz water daily) Keep headache diary Maintain proper stress management Maintain proper sleep hygiene Do not skip meals Consider supplements:  magnesium citrate 400mg  daily, riboflavin 400mg  daily, coenzyme Q10 100mg  three times daily.

## 2024-06-16 ENCOUNTER — Ambulatory Visit (INDEPENDENT_AMBULATORY_CARE_PROVIDER_SITE_OTHER)

## 2024-06-16 VITALS — BP 108/71 | HR 62 | Temp 98.2°F | Resp 16 | Ht 62.0 in | Wt 229.4 lb

## 2024-06-16 DIAGNOSIS — N920 Excessive and frequent menstruation with regular cycle: Secondary | ICD-10-CM | POA: Diagnosis not present

## 2024-06-16 DIAGNOSIS — D500 Iron deficiency anemia secondary to blood loss (chronic): Secondary | ICD-10-CM

## 2024-06-16 DIAGNOSIS — D509 Iron deficiency anemia, unspecified: Secondary | ICD-10-CM

## 2024-06-16 MED ORDER — ACETAMINOPHEN 325 MG PO TABS
650.0000 mg | ORAL_TABLET | Freq: Once | ORAL | Status: AC
Start: 2024-06-16 — End: 2024-06-16
  Administered 2024-06-16: 650 mg via ORAL
  Filled 2024-06-16: qty 2

## 2024-06-16 MED ORDER — SODIUM CHLORIDE 0.9 % IV SOLN
300.0000 mg | Freq: Once | INTRAVENOUS | Status: AC
  Administered 2024-06-16: 300 mg via INTRAVENOUS
  Filled 2024-06-16: qty 15

## 2024-06-16 MED ORDER — DIPHENHYDRAMINE HCL 25 MG PO CAPS
25.0000 mg | ORAL_CAPSULE | Freq: Once | ORAL | Status: AC
  Administered 2024-06-16: 25 mg via ORAL
  Filled 2024-06-16: qty 1

## 2024-06-16 NOTE — Progress Notes (Signed)
 Diagnosis: Iron  Deficiency Anemia  Provider:  Praveen Mannam MD  Procedure: IV Infusion  IV Type: Peripheral, IV Location: L Antecubital  Venofer  (Iron  Sucrose), Dose: 200 mg  Infusion Start Time: 1030  Infusion Stop Time: 1212  Post Infusion IV Care: Observation period completed and Peripheral IV Discontinued  Discharge: Condition: Good, Destination: Home . AVS Declined  Performed by:  Iesha Summerhill, RN

## 2024-06-20 ENCOUNTER — Telehealth: Payer: Self-pay | Admitting: Neurology

## 2024-06-20 NOTE — Telephone Encounter (Signed)
 Spoke to the pharmacy insurance will cover 18 tabs not 20.   Pharmacy changed to 18 tabs and will have it ready for the patient.    Patient advised.

## 2024-06-20 NOTE — Telephone Encounter (Signed)
Patient is returning sheenas call.

## 2024-06-20 NOTE — Telephone Encounter (Signed)
 Pt called in this morning and she can not get her Ondansetron  medicine due to her insurance. Pt wants to know what she  needs do.

## 2024-08-01 DIAGNOSIS — G8929 Other chronic pain: Secondary | ICD-10-CM | POA: Diagnosis not present

## 2024-08-01 DIAGNOSIS — D649 Anemia, unspecified: Secondary | ICD-10-CM | POA: Diagnosis not present

## 2024-08-01 DIAGNOSIS — J302 Other seasonal allergic rhinitis: Secondary | ICD-10-CM | POA: Diagnosis not present

## 2024-08-01 DIAGNOSIS — G43011 Migraine without aura, intractable, with status migrainosus: Secondary | ICD-10-CM | POA: Diagnosis not present

## 2024-08-01 DIAGNOSIS — E669 Obesity, unspecified: Secondary | ICD-10-CM | POA: Diagnosis not present

## 2024-08-08 ENCOUNTER — Ambulatory Visit: Admitting: Hematology and Oncology

## 2024-08-08 ENCOUNTER — Other Ambulatory Visit

## 2024-08-15 ENCOUNTER — Inpatient Hospital Stay: Attending: Hematology and Oncology

## 2024-08-15 ENCOUNTER — Encounter: Payer: Self-pay | Admitting: Hematology and Oncology

## 2024-08-15 ENCOUNTER — Inpatient Hospital Stay: Admitting: Hematology and Oncology

## 2024-08-15 VITALS — BP 111/68 | HR 70 | Resp 18 | Ht 62.0 in | Wt 233.2 lb

## 2024-08-15 DIAGNOSIS — N920 Excessive and frequent menstruation with regular cycle: Secondary | ICD-10-CM | POA: Insufficient documentation

## 2024-08-15 DIAGNOSIS — D500 Iron deficiency anemia secondary to blood loss (chronic): Secondary | ICD-10-CM | POA: Insufficient documentation

## 2024-08-15 LAB — TSH: TSH: 2.96 u[IU]/mL (ref 0.350–4.500)

## 2024-08-15 LAB — IRON AND IRON BINDING CAPACITY (CC-WL,HP ONLY)
Iron: 112 ug/dL (ref 28–170)
Saturation Ratios: 36 % — ABNORMAL HIGH (ref 10.4–31.8)
TIBC: 311 ug/dL (ref 250–450)
UIBC: 199 ug/dL (ref 148–442)

## 2024-08-15 LAB — CBC WITH DIFFERENTIAL (CANCER CENTER ONLY)
Abs Immature Granulocytes: 0.01 K/uL (ref 0.00–0.07)
Basophils Absolute: 0 K/uL (ref 0.0–0.1)
Basophils Relative: 1 %
Eosinophils Absolute: 0.1 K/uL (ref 0.0–0.5)
Eosinophils Relative: 3 %
HCT: 32.8 % — ABNORMAL LOW (ref 36.0–46.0)
Hemoglobin: 11 g/dL — ABNORMAL LOW (ref 12.0–15.0)
Immature Granulocytes: 0 %
Lymphocytes Relative: 43 %
Lymphs Abs: 1.3 K/uL (ref 0.7–4.0)
MCH: 30.1 pg (ref 26.0–34.0)
MCHC: 33.5 g/dL (ref 30.0–36.0)
MCV: 89.9 fL (ref 80.0–100.0)
Monocytes Absolute: 0.3 K/uL (ref 0.1–1.0)
Monocytes Relative: 9 %
Neutro Abs: 1.4 K/uL — ABNORMAL LOW (ref 1.7–7.7)
Neutrophils Relative %: 44 %
Platelet Count: 310 K/uL (ref 150–400)
RBC: 3.65 MIL/uL — ABNORMAL LOW (ref 3.87–5.11)
RDW: 12.7 % (ref 11.5–15.5)
WBC Count: 3 K/uL — ABNORMAL LOW (ref 4.0–10.5)
nRBC: 0 % (ref 0.0–0.2)

## 2024-08-15 LAB — RETICULOCYTES
Immature Retic Fract: 5.4 % (ref 2.3–15.9)
RBC.: 3.67 MIL/uL — ABNORMAL LOW (ref 3.87–5.11)
Retic Count, Absolute: 44 K/uL (ref 19.0–186.0)
Retic Ct Pct: 1.2 % (ref 0.4–3.1)

## 2024-08-15 LAB — VITAMIN B12: Vitamin B-12: 427 pg/mL (ref 180–914)

## 2024-08-15 LAB — FERRITIN: Ferritin: 238 ng/mL (ref 11–307)

## 2024-08-15 LAB — SEDIMENTATION RATE: Sed Rate: 9 mm/h (ref 0–22)

## 2024-08-15 NOTE — Assessment & Plan Note (Addendum)
 The most likely cause of her anemia is due to chronic blood loss/malabsorption syndrome.  She had received 3 doses of intravenous iron  sucrose in July and tolerated that well Unfortunately, repeat CBC today show modest improvement of anemia but not all the way back to normal Additional test including repeat iron  studies, vitamin B12 and thyroid  function test is still pending I will call her with test results as soon as I have all the results back to determine the next step She will likely need further intravenous iron  infusion

## 2024-08-15 NOTE — Progress Notes (Signed)
   [facility] OFFICE PROGRESS NOTE  Crystal Figueroa, [place]  ASSESSMENT & PLAN:  Assessment & Plan Iron  deficiency anemia due to chronic blood loss The most likely cause of her anemia is due to chronic blood loss/malabsorption syndrome.  She had received 3 doses of intravenous iron  sucrose in July and tolerated that well Unfortunately, repeat CBC today show modest improvement of anemia but not all the way back to normal Additional test including repeat iron  studies, vitamin B12 and thyroid  function test is still pending I will call her with test results as soon as I have all the results back to determine the next step She will likely need further intravenous iron  infusion    No orders of the defined types were placed in this encounter.   INTERVAL HISTORY: Patient returns for recurrent anemia Symptoms of anemia includes none She denies menorrhagia We reviewed CBC result  SUMMARY OF HEMATOLOGIC HISTORY:  She was found to have abnormal CBC from recent blood work I have the opportunity to review his CBC electronically dated back to April 2009 Between 2009 to 2025, she has chronic anemia She have significant blood loss related to her childbirth in 2013 requiring blood transfusion support Most recently, she had repeat labs done with her primary care doctor On December 28, 2023, white count 3.7, hemoglobin 10.2, platelet count 338 Iron  studies show ferritin of 35 with 18% saturation She complained of fatigue and occasional dizziness.  She has very frequent migraines She denies recent chest pain on exertion, shortness of breath on minimal exertion, pre-syncopal episodes, or palpitations. She had not noticed any recent bleeding such as epistaxis, hematuria or hematochezia The patient denies over the counter NSAID ingestion. She is not on antiplatelets agents.  She had no prior history or diagnosis of cancer. Her age appropriate screening programs are up-to-date. She  denies any pica and eats a variety of diet. She never donated blood Since 2013, she has excessive heavy menstruation especially over the last few years Her IUD was removed several years ago Her menorrhagia lasts about 7 days with a regular cycle but passage of large amount of clots She has been taking oral iron  supplement daily for the past few months but is causing some GI distress In July 2025: she received 3 doses IV iron  sucrose  Lab Results  Component Value Date   HGB 11.0 (L) 08/15/2024   RBC 3.67 (L) 08/15/2024   Vitals:   08/15/24 1117  BP: 111/68  Pulse: 70  Resp: 18  SpO2: 99%

## 2024-08-18 ENCOUNTER — Ambulatory Visit: Payer: Self-pay | Admitting: Hematology and Oncology

## 2024-08-18 NOTE — Telephone Encounter (Signed)
 Called and given below message from Dr. Lonn. She verbalized understanding. Appts scheduled.

## 2024-08-18 NOTE — Telephone Encounter (Signed)
-----   Message from Almarie Bedford sent at 08/18/2024  9:34 AM EDT ----- All her labs came back good Please schedule labs and see me in 6 months ----- Message ----- From: Rebecka, [facility] [place] Sent: 08/15/2024  11:15 AM EDT To: Almarie Bedford, MD

## 2024-08-29 DIAGNOSIS — J302 Other seasonal allergic rhinitis: Secondary | ICD-10-CM | POA: Diagnosis not present

## 2024-08-29 DIAGNOSIS — D649 Anemia, unspecified: Secondary | ICD-10-CM | POA: Diagnosis not present

## 2024-08-29 DIAGNOSIS — G43011 Migraine without aura, intractable, with status migrainosus: Secondary | ICD-10-CM | POA: Diagnosis not present

## 2024-08-29 DIAGNOSIS — E669 Obesity, unspecified: Secondary | ICD-10-CM | POA: Diagnosis not present

## 2024-08-29 DIAGNOSIS — G8929 Other chronic pain: Secondary | ICD-10-CM | POA: Diagnosis not present

## 2024-09-10 ENCOUNTER — Encounter (HOSPITAL_COMMUNITY): Payer: Self-pay

## 2024-09-10 ENCOUNTER — Ambulatory Visit (HOSPITAL_COMMUNITY)
Admission: EM | Admit: 2024-09-10 | Discharge: 2024-09-10 | Disposition: A | Discharge status: Home / Self Care | Attending: Internal Medicine | Admitting: Internal Medicine

## 2024-09-10 DIAGNOSIS — R0981 Nasal congestion: Secondary | ICD-10-CM | POA: Diagnosis not present

## 2024-09-10 LAB — POC COVID19/FLU A&B COMBO
Covid Antigen, POC: NEGATIVE
Influenza A Antigen, POC: NEGATIVE
Influenza B Antigen, POC: NEGATIVE

## 2024-09-10 MED ORDER — MELOXICAM 15 MG PO TABS: 15.0000 mg | ORAL_TABLET | Freq: Every day | ORAL | 0 refills | Status: AC

## 2024-09-10 NOTE — ED Triage Notes (Signed)
 Pt c/o cough and congestion since yesterday. Took robitussin with no relief.   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

## 2024-09-10 NOTE — Discharge Instructions (Addendum)
 Testing for covid and flu was negative. Your symptoms seem most consistent with a viral upper respiratory tract infection. I recommend staying well-hydrated, using over the counter cough and cold medications, and getting plenty of rest.   Your exam is most consistent with a wrist sprain. I will send a prescription for meloxicam to your pharmacy and have been fitted for a wrist brace today. Return to care if pain worsens or does not improve.
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
## 2024-09-26 ENCOUNTER — Telehealth: Payer: Self-pay | Admitting: Neurology

## 2024-09-26 DIAGNOSIS — G8929 Other chronic pain: Secondary | ICD-10-CM | POA: Diagnosis not present

## 2024-09-26 DIAGNOSIS — E669 Obesity, unspecified: Secondary | ICD-10-CM | POA: Diagnosis not present

## 2024-09-26 DIAGNOSIS — G43011 Migraine without aura, intractable, with status migrainosus: Secondary | ICD-10-CM | POA: Diagnosis not present

## 2024-09-26 DIAGNOSIS — J302 Other seasonal allergic rhinitis: Secondary | ICD-10-CM | POA: Diagnosis not present

## 2024-09-26 DIAGNOSIS — D649 Anemia, unspecified: Secondary | ICD-10-CM | POA: Diagnosis not present

## 2024-09-26 MED ORDER — QULIPTA 60 MG PO TABS: 60.0000 mg | ORAL_TABLET | Freq: Every day | ORAL | 5 refills | Status: DC

## 2024-09-26 MED ORDER — ONDANSETRON 4 MG PO TBDP: 4.0000 mg | ORAL_TABLET | Freq: Three times a day (TID) | ORAL | 5 refills | Status: DC | PRN

## 2024-09-26 NOTE — Telephone Encounter (Signed)
 How frequent or the headaches (on average, how many days a week/month are they occurring)?  Everyday How long do the headaches last?  Per Patient the headaches are everyday on and off with Dizziness.  Verify what preventative medication and dose you are taking (e.g. topiramate, propranolol, amitriptyline, Emgality, etc)  Aimovig  Verify which rescue medication you are taking (triptan, Advil , Excedrin, Aleve , Ubrelvy, etc)  Rizatriptan  How often are you taking pain relievers/analgesics/rescue mediction?  NONE  Per patient she is scared to drive due to the Dizziness

## 2024-09-26 NOTE — Telephone Encounter (Signed)
 All we can do is change preventative medication. I would like to change her from Aimovig  to a daily pill called Qulipta 60mg  daily, qty 30, refill 5. We must give this medication 3 months before determining if it is effective.     Patient agrees to the Qulipta 60 mg.

## 2024-09-26 NOTE — Telephone Encounter (Signed)
 Pt still having headaches causing noise in head and dizziness, Rx is not helping still going thru above symptoms would like a call bk as next appt is in added to waitlist

## 2024-10-05 ENCOUNTER — Encounter (HOSPITAL_COMMUNITY): Payer: Self-pay

## 2024-10-05 ENCOUNTER — Emergency Department (HOSPITAL_COMMUNITY)
Admission: EM | Admit: 2024-10-05 | Discharge: 2024-10-06 | Disposition: A | Attending: Emergency Medicine | Admitting: Emergency Medicine

## 2024-10-05 ENCOUNTER — Emergency Department (HOSPITAL_COMMUNITY)

## 2024-10-05 ENCOUNTER — Other Ambulatory Visit: Payer: Self-pay

## 2024-10-05 DIAGNOSIS — R1013 Epigastric pain: Secondary | ICD-10-CM | POA: Diagnosis not present

## 2024-10-05 LAB — CBC WITH DIFFERENTIAL/PLATELET
Abs Immature Granulocytes: 0.02 K/uL (ref 0.00–0.07)
Basophils Absolute: 0 K/uL (ref 0.0–0.1)
Basophils Relative: 0 %
Eosinophils Absolute: 0.1 K/uL (ref 0.0–0.5)
Eosinophils Relative: 2 %
HCT: 33.5 % — ABNORMAL LOW (ref 36.0–46.0)
Hemoglobin: 11 g/dL — ABNORMAL LOW (ref 12.0–15.0)
Immature Granulocytes: 0 %
Lymphocytes Relative: 25 %
Lymphs Abs: 1.5 K/uL (ref 0.7–4.0)
MCH: 30.2 pg (ref 26.0–34.0)
MCHC: 32.8 g/dL (ref 30.0–36.0)
MCV: 92 fL (ref 80.0–100.0)
Monocytes Absolute: 0.4 K/uL (ref 0.1–1.0)
Monocytes Relative: 6 %
Neutro Abs: 3.9 K/uL (ref 1.7–7.7)
Neutrophils Relative %: 67 %
Platelets: 290 K/uL (ref 150–400)
RBC: 3.64 MIL/uL — ABNORMAL LOW (ref 3.87–5.11)
RDW: 12.1 % (ref 11.5–15.5)
WBC: 5.9 K/uL (ref 4.0–10.5)
nRBC: 0 % (ref 0.0–0.2)

## 2024-10-05 LAB — COMPREHENSIVE METABOLIC PANEL WITH GFR
ALT: 13 U/L (ref 0–44)
AST: 15 U/L (ref 15–41)
Albumin: 3.4 g/dL — ABNORMAL LOW (ref 3.5–5.0)
Alkaline Phosphatase: 44 U/L (ref 38–126)
Anion gap: 9 (ref 5–15)
BUN: 9 mg/dL (ref 6–20)
CO2: 23 mmol/L (ref 22–32)
Calcium: 8.7 mg/dL — ABNORMAL LOW (ref 8.9–10.3)
Chloride: 106 mmol/L (ref 98–111)
Creatinine, Ser: 0.71 mg/dL (ref 0.44–1.00)
GFR, Estimated: >60 mL/min (ref >60)
Glucose, Bld: 109 mg/dL — ABNORMAL HIGH (ref 70–99)
Potassium: 3.6 mmol/L (ref 3.5–5.1)
Sodium: 138 mmol/L (ref 135–145)
Total Bilirubin: 0.7 mg/dL (ref 0.0–1.2)
Total Protein: 6.5 g/dL (ref 6.5–8.1)

## 2024-10-05 LAB — LIPASE, BLOOD: Lipase: 24 U/L (ref 11–51)

## 2024-10-05 LAB — TROPONIN I (HIGH SENSITIVITY)
Troponin I (High Sensitivity): <2 ng/L (ref <18)
Troponin I (High Sensitivity): 3 ng/L (ref <18)

## 2024-10-05 LAB — HCG, SERUM, QUALITATIVE: Preg, Serum: NEGATIVE

## 2024-10-05 MED ORDER — ONDANSETRON 4 MG PO TBDP
4.0000 mg | ORAL_TABLET | Freq: Once | ORAL | Status: AC
  Administered 2024-10-06: 4 mg via ORAL
  Filled 2024-10-05 (×2): qty 1

## 2024-10-05 MED ORDER — ALUM & MAG HYDROXIDE-SIMETH 200-200-20 MG/5ML PO SUSP
30.0000 mL | Freq: Once | ORAL | Status: AC
  Administered 2024-10-06: 30 mL via ORAL
  Filled 2024-10-05: qty 30

## 2024-10-05 NOTE — ED Triage Notes (Signed)
 QUICK TRIAGE: Pt ambulatory to ER with c/o abdominal pain started after eating spicey food around 1:20 this afternoon.  PT reports n/v

## 2024-10-05 NOTE — ED Provider Triage Note (Signed)
 Emergency Medicine Provider Triage Evaluation Note  Crystal Figueroa , a 38 y.o. female  was evaluated in triage.  Pt complains of periumbilical and epigastirc pain after eating a raw spicy pepper and spicy vegetable soup. Reports 3 episodes of emesis. Some burning lower chest pain. Reports nausea  Review of Systems  Positive:  Negative:   Physical Exam  BP (!) 126/55 (BP Location: Right Arm)   Pulse 67   Temp 98.6 F (37 C)   Resp (!) 24   Ht 5' 2 (1.575 m)   LMP 08/19/2024 (Approximate)   SpO2 100%   BMI 42.65 kg/m  Gen:   Awake, no distress   Resp:  Normal effort  MSK:   Moves extremities without difficulty  Other:  Some epigastric tenderness to palpation.  Some also tenderness into the periumbilical some right upper quadrant.  Soft without guarding or rebound.  Medical Decision Making  Medically screening exam initiated at 3:34 PM.  Appropriate orders placed.  Crystal Figueroa was informed that the remainder of the evaluation will be completed by another provider, this initial triage assessment does not replace that evaluation, and the importance of remaining in the ED until their evaluation is complete.  Labs and ultrasound ordered.   Crystal Ernst, PA-C 10/05/24 1547

## 2024-10-06 MED ORDER — ACETAMINOPHEN 500 MG PO TABS
1000.0000 mg | ORAL_TABLET | Freq: Once | ORAL | Status: AC
  Administered 2024-10-06: 1000 mg via ORAL
  Filled 2024-10-06: qty 2

## 2024-10-06 MED ORDER — SUCRALFATE 1 G PO TABS: 1.0000 g | ORAL_TABLET | Freq: Three times a day (TID) | ORAL | 0 refills | Status: AC

## 2024-10-06 MED ORDER — OMEPRAZOLE 20 MG PO CPDR: 20.0000 mg | DELAYED_RELEASE_CAPSULE | Freq: Every day | ORAL | 0 refills | Status: AC

## 2024-10-06 NOTE — ED Provider Notes (Signed)
 [facility] Emergency Department Provider Note MRN:  [id number]  Arrival date & time: 10/06/24     Chief Complaint   Abdominal Pain   History of Present Illness   Crystal Figueroa is a 38 y.o. year-old female presents to the ED with chief complaint of burning in stomach.  She states that the onset was after eating spicy food earlier today.  She states that her symptoms have now mostly resolved.  She states that now she has slight headache.  She denies any fevers or chills.  Denies any chest pain or shortness of breath.  Denies nausea or vomiting.  Denies any treatment prior to arrival.  History provided by patient.   Review of Systems  Pertinent positive and negative review of systems noted in HPI.    Physical Exam   Vitals:   10/05/24 1523 10/05/24 2344  BP: (!) 126/55 114/69  Pulse: 67 77  Resp: (!) 24 16  Temp: 98.6 F (37 C) 98.1 F (36.7 C)  SpO2: 100% 100%    CONSTITUTIONAL:  non toxic-appearing, NAD NEURO:  Alert and oriented x 3, CN 3-12 grossly intact EYES:  eyes equal and reactive ENT/NECK:  Supple, no stridor  CARDIO:  normal rate, regular rhythm, appears well-perfused  PULM:  No respiratory distress, CTAB GI/GU:  non-distended, no focal abdominal tenderness MSK/SPINE:  No gross deformities, no edema, moves all extremities  SKIN:  no rash, atraumatic   *Additional and/or pertinent findings included in MDM below  Diagnostic and Interventional Summary    EKG Interpretation Date/Time:    Ventricular Rate:    PR Interval:    QRS Duration:    QT Interval:    QTC Calculation:   R Axis:      Text Interpretation:         Labs Reviewed  CBC WITH DIFFERENTIAL/PLATELET - Abnormal; Notable for the following components:      Result Value   RBC 3.64 (*)    Hemoglobin 11.0 (*)    HCT 33.5 (*)    All other components within normal limits  COMPREHENSIVE METABOLIC PANEL WITH GFR - Abnormal; Notable for the following components:    Glucose, Bld 109 (*)    Calcium  8.7 (*)    Albumin 3.4 (*)    All other components within normal limits  LIPASE, BLOOD  HCG, SERUM, QUALITATIVE  TROPONIN I (HIGH SENSITIVITY)  TROPONIN I (HIGH SENSITIVITY)    US  Abdomen Limited RUQ (LIVER/GB)  Final Result      Medications  alum & mag hydroxide-simeth (MAALOX/MYLANTA) 200-200-20 MG/5ML suspension 30 mL (30 mLs Oral Given 10/06/24 0244)  ondansetron  (ZOFRAN -ODT) disintegrating tablet 4 mg (4 mg Oral Given 10/06/24 0243)  acetaminophen  (TYLENOL ) tablet 1,000 mg (1,000 mg Oral Given 10/06/24 0243)     Procedures  /  Critical Care Procedures  ED Course and Medical Decision Making  I have reviewed the triage vital signs, the nursing notes, and pertinent available records from the EMR.  Social Determinants Affecting Complexity of Care: Patient has no clinically significant social determinants affecting this chief complaint..   ED Course:    Medical Decision Making Patient here with episode of burning sensation in her stomach.  Onset was after eating spicy food earlier today.  She says that most of her symptoms have resolved.  Her vitals are stable.  Laboratory workup performed in triage was reassuring.  She has negative troponins, doubt ACS, no leukocytosis, mild anemia at 11.0, will have patient follow-up with PCP  for this.  No significant electrolyte abnormality.  Lipase is normal, doubt pancreatitis.  LFTs are all normal, doubt gallbladder disease.  Right upper quadrant ultrasound was ordered in triage and is normal, no gallstones, no evidence of cholecystitis.  Patient looks very well.  Will treat with GI cocktail and anticipate discharge home.  Patient request something for headache.  Will treat with Tylenol .  Patient reassessed and is feeling much better.  Will discharge home with carafate and omeprazole.  Risk OTC drugs. Prescription drug management.         Consultants: No consultations were needed in caring for  this patient.   Treatment and Plan: Emergency department workup does not suggest an emergent condition requiring admission or immediate intervention beyond  what has been performed at this time. The patient is safe for discharge and has  been instructed to return immediately for worsening symptoms, change in  symptoms or any other concerns    Final Clinical Impressions(s) / ED Diagnoses     ICD-10-CM   1. Epigastric pain  R10.13       ED Discharge Orders          Ordered    omeprazole (PRILOSEC) 20 MG capsule  Daily        10/06/24 0339    sucralfate (CARAFATE) 1 g tablet  3 times daily with meals & bedtime        10/06/24 9660              Discharge Instructions Discussed with and Provided to Patient:   Discharge Instructions   None      Vicky Charleston, PA-C 10/06/24 0441    Jerral Meth, MD 10/06/24 [phone]

## 2024-11-03 ENCOUNTER — Encounter: Payer: Self-pay | Admitting: Neurology

## 2024-11-03 ENCOUNTER — Ambulatory Visit: Admitting: Neurology

## 2024-11-03 ENCOUNTER — Telehealth: Payer: Self-pay

## 2024-11-03 VITALS — BP 100/67 | HR 69 | Ht 65.0 in | Wt 233.6 lb

## 2024-11-03 DIAGNOSIS — G43109 Migraine with aura, not intractable, without status migrainosus: Secondary | ICD-10-CM

## 2024-11-03 MED ORDER — ONDANSETRON 4 MG PO TBDP: 4.0000 mg | ORAL_TABLET | Freq: Three times a day (TID) | ORAL | 5 refills | Status: AC | PRN

## 2024-11-03 MED ORDER — QULIPTA 60 MG PO TABS: 60.0000 mg | ORAL_TABLET | Freq: Every day | ORAL | 5 refills | Status: AC

## 2024-11-03 MED ORDER — RIZATRIPTAN BENZOATE 10 MG PO TBDP: 10.0000 mg | ORAL_TABLET | ORAL | 11 refills | Status: AC | PRN

## 2024-11-03 NOTE — Patient Instructions (Signed)
 Start QULIPTA  60MG  DAILY.  If no improvement in 3 months, contact me and we can change medication RIZATRIPTAN  and ONDANSETRON  as needed for migraine attack Limit use of pain relievers to no more than 9 days out of the month to prevent risk of rebound or medication-overuse headache. Keep headache diary Follow up 6 months.
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
## 2024-11-03 NOTE — Telephone Encounter (Signed)
 PA needed for [facility]

## 2024-11-04 ENCOUNTER — Other Ambulatory Visit (HOSPITAL_COMMUNITY): Payer: Self-pay

## 2024-11-04 ENCOUNTER — Telehealth: Payer: Self-pay

## 2024-11-04 NOTE — Telephone Encounter (Signed)
 PA request has been Submitted. New Encounter has been or will be created for follow up. For additional info see Pharmacy Prior Auth telephone encounter from 11-04-2024.

## 2024-11-04 NOTE — Telephone Encounter (Signed)
 Pharmacy Patient Advocate Encounter   Received notification from Pt Calls Messages that prior authorization for Qulipta  60MG  tablets  is required/requested.   Insurance verification completed.   The patient is insured through [employer] [facility].   Per test claim: PA required; PA submitted to above mentioned insurance via Latent Key/confirmation #/[id number] [facility] Status is pending

## 2024-11-05 ENCOUNTER — Other Ambulatory Visit (HOSPITAL_COMMUNITY): Payer: Self-pay

## 2024-11-17 ENCOUNTER — Ambulatory Visit
Admission: RE | Admit: 2024-11-17 | Discharge: 2024-11-17 | Disposition: A | Discharge status: Home / Self Care | Source: Ambulatory Visit | Attending: Physician Assistant | Admitting: Physician Assistant

## 2024-11-17 ENCOUNTER — Other Ambulatory Visit

## 2024-11-17 DIAGNOSIS — N6310 Unspecified lump in the right breast, unspecified quadrant: Secondary | ICD-10-CM

## 2025-02-10 ENCOUNTER — Ambulatory Visit: Admitting: Neurology

## 2025-02-13 ENCOUNTER — Ambulatory Visit: Admitting: Hematology and Oncology

## 2025-02-13 ENCOUNTER — Other Ambulatory Visit

## 2025-05-06 ENCOUNTER — Ambulatory Visit: Payer: Self-pay | Admitting: Neurology
